# Patient Record
Sex: Female | Born: 1955 | Race: White | Hispanic: No | Smoking: Current every day smoker
Health system: Southern US, Community
[De-identification: ages and names within clinical notes are randomized; demographics above are authoritative.]

## PROBLEM LIST (undated history)

## (undated) DIAGNOSIS — R569 Unspecified convulsions: Secondary | ICD-10-CM

## (undated) DIAGNOSIS — I82409 Acute embolism and thrombosis of unspecified deep veins of unspecified lower extremity: Secondary | ICD-10-CM

## (undated) HISTORY — PX: CHOLECYSTECTOMY: SHX55

---

## 2001-07-21 ENCOUNTER — Inpatient Hospital Stay (HOSPITAL_COMMUNITY): Admission: EM | Admit: 2001-07-21 | Discharge: 2001-07-22 | Payer: Self-pay | Admitting: Emergency Medicine

## 2001-07-21 ENCOUNTER — Encounter: Payer: Self-pay | Admitting: Emergency Medicine

## 2001-08-20 ENCOUNTER — Emergency Department (HOSPITAL_COMMUNITY): Admission: EM | Admit: 2001-08-20 | Discharge: 2001-08-20 | Payer: Self-pay

## 2003-05-05 ENCOUNTER — Emergency Department (HOSPITAL_COMMUNITY): Admission: AD | Admit: 2003-05-05 | Discharge: 2003-05-05 | Payer: Self-pay | Admitting: Emergency Medicine

## 2003-12-06 ENCOUNTER — Emergency Department (HOSPITAL_COMMUNITY): Admission: EM | Admit: 2003-12-06 | Discharge: 2003-12-06 | Payer: Self-pay | Admitting: Emergency Medicine

## 2003-12-12 ENCOUNTER — Emergency Department (HOSPITAL_COMMUNITY): Admission: AD | Admit: 2003-12-12 | Discharge: 2003-12-12 | Payer: Self-pay | Admitting: Emergency Medicine

## 2003-12-20 ENCOUNTER — Emergency Department (HOSPITAL_COMMUNITY): Admission: EM | Admit: 2003-12-20 | Discharge: 2003-12-20 | Payer: Self-pay | Admitting: Emergency Medicine

## 2008-11-08 ENCOUNTER — Emergency Department (HOSPITAL_COMMUNITY): Admission: EM | Admit: 2008-11-08 | Discharge: 2008-11-08 | Payer: Self-pay | Admitting: Emergency Medicine

## 2008-11-16 ENCOUNTER — Emergency Department (HOSPITAL_COMMUNITY): Admission: EM | Admit: 2008-11-16 | Discharge: 2008-11-16 | Payer: Self-pay | Admitting: Emergency Medicine

## 2011-02-02 LAB — DIFFERENTIAL
Basophils Absolute: 0 10*3/uL (ref 0.0–0.1)
Basophils Relative: 0 % (ref 0–1)
Eosinophils Absolute: 0 10*3/uL (ref 0.0–0.7)
Eosinophils Absolute: 0 10*3/uL (ref 0.0–0.7)
Eosinophils Relative: 1 % (ref 0–5)
Lymphocytes Relative: 12 % (ref 12–46)
Lymphocytes Relative: 20 % (ref 12–46)
Lymphs Abs: 0.4 10*3/uL — ABNORMAL LOW (ref 0.7–4.0)
Monocytes Relative: 5 % (ref 3–12)
Monocytes Relative: 6 % (ref 3–12)
Neutro Abs: 2.7 10*3/uL (ref 1.7–7.7)
Neutrophils Relative %: 73 % (ref 43–77)
Neutrophils Relative %: 82 % — ABNORMAL HIGH (ref 43–77)

## 2011-02-02 LAB — URINALYSIS, ROUTINE W REFLEX MICROSCOPIC: Nitrite: NEGATIVE

## 2011-02-02 LAB — CBC
HCT: 37.6 % (ref 36.0–46.0)
HCT: 40.7 % (ref 36.0–46.0)
MCHC: 32.5 g/dL (ref 30.0–36.0)
MCV: 77.9 fL — ABNORMAL LOW (ref 78.0–100.0)
MCV: 79 fL (ref 78.0–100.0)
Platelets: 205 10*3/uL (ref 150–400)
RDW: 17.3 % — ABNORMAL HIGH (ref 11.5–15.5)
WBC: 3.7 10*3/uL — ABNORMAL LOW (ref 4.0–10.5)

## 2011-02-02 LAB — RAPID URINE DRUG SCREEN, HOSP PERFORMED
Amphetamines: NOT DETECTED
Barbiturates: POSITIVE — AB
Cocaine: NOT DETECTED
Opiates: NOT DETECTED

## 2011-02-02 LAB — COMPREHENSIVE METABOLIC PANEL
ALT: 45 U/L — ABNORMAL HIGH (ref 0–35)
ALT: 60 U/L — ABNORMAL HIGH (ref 0–35)
AST: 73 U/L — ABNORMAL HIGH (ref 0–37)
Albumin: 3.6 g/dL (ref 3.5–5.2)
Alkaline Phosphatase: 122 U/L — ABNORMAL HIGH (ref 39–117)
BUN: 4 mg/dL — ABNORMAL LOW (ref 6–23)
BUN: 6 mg/dL (ref 6–23)
Calcium: 8.9 mg/dL (ref 8.4–10.5)
Chloride: 103 mEq/L (ref 96–112)
Creatinine, Ser: 0.7 mg/dL (ref 0.4–1.2)
GFR calc Af Amer: >60 mL/min (ref >60)
GFR calc non Af Amer: >60 mL/min (ref >60)
Potassium: 3.1 mEq/L — ABNORMAL LOW (ref 3.5–5.1)
Potassium: 3.5 mEq/L (ref 3.5–5.1)
Total Protein: 7 g/dL (ref 6.0–8.3)

## 2011-02-02 LAB — LIPASE, BLOOD: Lipase: 24 U/L (ref 11–59)

## 2011-02-02 LAB — ETHANOL: Alcohol, Ethyl (B): 8 mg/dL (ref 0–10)

## 2011-02-02 LAB — POCT CARDIAC MARKERS: Troponin i, poc: <0.05 ng/mL (ref 0.00–0.09)

## 2011-03-06 NOTE — Discharge Summary (Signed)
[place]  Patient:    Alejandra Schneider, Alejandra Schneider Visit Number: [id number] MRN: [id number]          Service Type: MED Location: [phone] Attending Physician:  Phifer, Trinna Post Dictated by:   Rosemarie Ax, M.D. Admit Date:  07/21/2001 Discharge Date: 07/22/2001                             Discharge Summary  DATE OF BIRTH:  September 07, 1966.  SERVICE:  Medical teaching service B.  DISCHARGE MEDICATIONS: 1. Continue receiving Methadone at the [facility]. 2. Iron 325 mg 1 tablet p.o. b.i.d. for anemia. 3. K-Dur 40 mEq 1 tablet p.o. on the night of discharge, then one tablet    p.o. q.d. until the patient goes to the Internal Medicine Clinic at    [facility] to have her potassium level checked.  ACTIVITY:  No instructions.  DIET:  Take clear liquids today and then advance her diet as tolerated.  WOUND CARE:  As advised by Dr. Quillian Quince at [facility].  FOLLOW-UP:  Go to the Internal Medicine Clinic on Monday, July 25, 2001 at 3:30 p.m. to have a potassium level checked.  The patient was also advised to begin seeing a regular doctor.  DISCHARGE DIAGNOSES: 1. Dehydration secondary to nausea and vomiting. 2. Ruled out myocardial infarction. 3. Iron-deficiency anemia. 4. Hypokalemia.  HISTORY OF PRESENT ILLNESS:  Ms. Alejandra Schneider is a 55 year old Caucasian female with a history of heroine addiction who presented to the [facility] Emergency Room with intractable nausea and vomiting and abdominal pain since her laparoscopic cholecystectomy five days ago at [facility].  The patient was discharged from the hospital on postoperative day one, where she went home and continued to have frequent emesis which she reported to be bloody and bilious.  The patient went back to the [facility] Emergency Room where she was admitted overnight and treated with  Dilaudid and Phenergan and then discharged the next day.  The patient stated that she continued to have abdominal pain and vomiting as well as black, tarry stools from her rectum and vaginal bleeding, although her last menstrual period about one week ago.  HOSPITAL COURSE: #1 - ABDOMINAL PAIN/DEHYDRATION:  The patient was treated with IV fluids, made NPO.  She was given IV Phenergan and Protonix.  While in the [facility] Emergency Room, she also reported an episode of chest pain, which will be discussed in the next paragraph.  The patients labs on admission were remarkable for hemoccult negative, amylase 79, lipase 32.  Urinalysis remarkable for small bilirubin, 40 ketones, large blood, trace leukocyte esterase, negative for nitrates.  Admission CBC shows white blood cell count 7.1, hemoglobin 11.5, platelets 218,000. Admission complete metabolic panel shows sodium 138, potassium 3.3, chloride 105, CO2 28, glucose 153, BUN 6, creatinine 0.7, total bilirubin 0.6, alkaline phosphatase 79, AST 27, ALT 22, total protein 6.1, albumin 2.9, calcium 8.5.  On exam, there were no signs of peritonitis or acute bleed and the patients hemoglobin stayed relatively constant during hospitalization.  The patient continued to complain of abdominal pain, but peritonitis, intra-abdominal acute bleed, pancreatitis, gallbladder disease, urinary tract infection, hepatitis were all ruled out by lab values and exam.  By hospital day #1, the patient was able to tolerate clear diet and had slept overnight without requiring Phenergan and had no  emesis whatsoever.  The patient did have a few episodes of diarrhea during hospitalization.  She remained afebrile during hospitalization and her white blood cell count remained stable.  Blood cultures were negative at the time of dictation, drawn on August 17, 2001 but the final reading was still pending.  An acute hepatitis panel was negative for  hepatitis B surface antigen, hepatitis B core IgM antibody, hepatitis A IgM antibody.  Also of note, urine microscopic showed white blood cell count 3 to 6, rbcs 0 to 2, few bacteria.  Complete metabolic panel on the day of discharge overall showed no change.  #2 - CHEST PAIN/RULE OUT MYOCARDIAL INFARCTION:  The patient complained of one episode of chest pain while in the ER.  She had an unremarkable EKG and three cycles of cardiac enzymes were normal.  She did not have any other chest pain during hospitalization.  Chest x-ray on admission was unremarkable and a chest CT on admission showed no evidence of pulmonary embolus, large hiatal hernia, minimal bibasilar atelectasis and scarring.  A limited CT of the lower extremities showed a very faint area of decreased attenuation of the left popliteal band.  So, a short segment DVT could not be excluded; however, the patient did have negative lower extremity dopplers during hospitalization.  So, a PE was ruled out as well.  #3 - IRON-DEFICIENCY ANEMIA:  The patients hemoglobin and hematocrit were low during hospitalization, with hemoglobin around 10 and hematocrit around 30. The patient had iron studies which showed ferratin at the low end of normal range at 16.  Iron low at 26.  Total iron binding capacity normal at 314, percent saturation low at 8.  The patient was thus diagnosed with iron deficient anemia consistent with being a menstruating female.  She was started on iron supplements.  #4 - HYPOKALEMIA:  The patients potassium was low during hospitalization at 3.3 on admission and then 2.9 on hospital day #1.  The patient was supplemented with p.o. potassium and given potassium to take with her and told to go to the Internal Medicine Clinic on July 25, 2001, which is three days after discharge, to have her potassium level checked.  The patient did not show any neurological signs of hypokalemia on exam.  #5 - HISTORY OF HEROINE  ADDICTION:  The patient currently receives methadone  from the [facility].  Of note, she is being rapidly tapered down on her methadone dose as she does not attend a group meeting and therefore does not meet the criteria to receive methadone from this clinic. The patient is extremely irate about this.  The methadone clinic informed us that her current dose with them is 20 mg of methadone per day, so that does was continued during hospitalization.  No opiate withdrawal symptoms were noted. Dictated by:   Rosemarie Ax, M.D. Attending Physician:  Phifer, Trinna Post DD:  07/22/01 TD:  07/24/01 Job: [phone] [id number]

## 2011-07-14 ENCOUNTER — Emergency Department (HOSPITAL_COMMUNITY)
Admission: EM | Admit: 2011-07-14 | Discharge: 2011-07-14 | Disposition: A | Discharge status: Home / Self Care | Payer: Self-pay | Attending: Emergency Medicine | Admitting: Emergency Medicine

## 2011-07-14 DIAGNOSIS — F3289 Other specified depressive episodes: Secondary | ICD-10-CM | POA: Insufficient documentation

## 2011-07-14 DIAGNOSIS — F411 Generalized anxiety disorder: Secondary | ICD-10-CM | POA: Insufficient documentation

## 2011-07-14 DIAGNOSIS — F329 Major depressive disorder, single episode, unspecified: Secondary | ICD-10-CM | POA: Insufficient documentation

## 2011-07-14 LAB — RAPID URINE DRUG SCREEN, HOSP PERFORMED
Amphetamines: NOT DETECTED
Barbiturates: NOT DETECTED
Tetrahydrocannabinol: NOT DETECTED

## 2011-07-14 LAB — POCT I-STAT, CHEM 8
BUN: 13 mg/dL (ref 6–23)
Creatinine, Ser: 0.9 mg/dL (ref 0.50–1.10)
HCT: 43 % (ref 36.0–46.0)
Potassium: 4.4 mEq/L (ref 3.5–5.1)
TCO2: 29 mmol/L (ref 0–100)

## 2011-07-14 LAB — ETHANOL: Alcohol, Ethyl (B): <11 mg/dL (ref 0–11)

## 2012-02-01 ENCOUNTER — Emergency Department (HOSPITAL_COMMUNITY)
Admission: EM | Admit: 2012-02-01 | Discharge: 2012-02-01 | Disposition: A | Discharge status: Home / Self Care | Payer: Self-pay | Attending: Emergency Medicine | Admitting: Emergency Medicine

## 2012-02-01 ENCOUNTER — Encounter (HOSPITAL_COMMUNITY): Payer: Self-pay | Admitting: *Deleted

## 2012-02-01 DIAGNOSIS — F17200 Nicotine dependence, unspecified, uncomplicated: Secondary | ICD-10-CM | POA: Insufficient documentation

## 2012-02-01 DIAGNOSIS — G4700 Insomnia, unspecified: Secondary | ICD-10-CM

## 2012-02-01 DIAGNOSIS — Z86718 Personal history of other venous thrombosis and embolism: Secondary | ICD-10-CM | POA: Insufficient documentation

## 2012-02-01 DIAGNOSIS — R011 Cardiac murmur, unspecified: Secondary | ICD-10-CM | POA: Insufficient documentation

## 2012-02-01 HISTORY — DX: Unspecified convulsions: R56.9

## 2012-02-01 HISTORY — DX: Acute embolism and thrombosis of unspecified deep veins of unspecified lower extremity: I82.409

## 2012-02-01 MED ORDER — ALPRAZOLAM 0.5 MG PO TABS: 0.5000 mg | ORAL_TABLET | Freq: Every evening | ORAL | Status: AC | PRN

## 2012-02-01 NOTE — ED Notes (Signed)
Pt states she has been "stressed out and unable to sleep" for 2 weeks. Was "turned down" for Medicaid and is trying to get disability. Lives with her brother. Hx of seizures but has been off her meds x 1 year. States had a seizure last week. Unable to drive due to seizures. Denies SI.

## 2012-02-01 NOTE — ED Notes (Signed)
Pt reports insomnia and anxious x 2 months.  Pt is requesting something for sleep and to calm her down.  Pt reports she does "not have no kinda help in any way."  Pt reports that she cannot drive and is having to depend on someone.

## 2012-02-01 NOTE — ED Provider Notes (Signed)
History     CSN: [id number]  Arrival date & time 02/01/12  1048   First MD Initiated Contact with Patient 02/01/12 1232      Chief Complaint  Patient presents with  . Insomnia    (Consider location/radiation/quality/duration/timing/severity/associated sxs/prior treatment) The history is provided by the patient.   the patient is a 56 year old, female, who presents to emergency department complaining that she cannot sleep.  She states that her husband.  Sister and mother all died over last 3 years.  Therefore, she is depressed and her migration is all that time.  She denies nightmares.  She denies any somatic symptoms.  She denies suicidal or homicidal thoughts.  She does not use drugs or alcohol.  Past Medical History  Diagnosis Date  . Seizures   . Deep vein thrombosis (DVT)     Past Surgical History  Procedure Date  . Cholecystectomy     No family history on file.  History  Substance Use Topics  . Smoking status: Current Everyday Smoker -- 4.0 packs/day    Types: Cigarettes  . Smokeless tobacco: Not on file  . Alcohol Use: No    OB History    Grav Para Term Preterm Abortions TAB SAB Ect Mult Living                  Review of Systems  Psychiatric/Behavioral: Positive for sleep disturbance. Negative for confusion and agitation.  All other systems reviewed and are negative.    Allergies  Codeine; Haldol; Penicillins; and Phenobarbital  Home Medications   Current Outpatient Rx  Name Route Sig Dispense Refill  . ACETAMINOPHEN 500 MG PO TABS Oral Take 500 mg by mouth every 6 (six) hours as needed. PAIN      BP 125/91  Pulse 92  Temp(Src) 98.3 F (36.8 C) (Oral)  Resp 12  SpO2 98%  Physical Exam  Vitals reviewed. Constitutional: She is oriented to person, place, and time. She appears well-developed and well-nourished.  HENT:  Head: Normocephalic and atraumatic.  Eyes: Conjunctivae are normal.  Neck: Normal range of motion.  Cardiovascular: Normal  rate.   No murmur heard. Pulmonary/Chest: Effort normal. No respiratory distress.  Musculoskeletal: Normal range of motion.  Neurological: She is alert and oriented to person, place, and time.  Skin: Skin is warm and dry.  Psychiatric: She has a normal mood and affect. Judgment and thought content normal.    ED Course  Procedures (including critical care time)  Labs Reviewed - No data to display No results found.   No diagnosis found.    MDM  insomnia        Cheri Guppy, MD 02/01/12 1536

## 2012-02-01 NOTE — ED Notes (Signed)
Reviewed Rx for Xanax and f/u with Behavioral Health.

## 2012-02-01 NOTE — Discharge Instructions (Signed)
Use xanax to help you sleep.  Follow up with Behavioral Health for reevaluation and discussion of your depression.  Return for worse symptoms.

## 2012-08-02 ENCOUNTER — Emergency Department (HOSPITAL_COMMUNITY)
Admission: EM | Admit: 2012-08-02 | Discharge: 2012-08-02 | Disposition: A | Discharge status: Home / Self Care | Payer: Self-pay | Attending: Emergency Medicine | Admitting: Emergency Medicine

## 2012-08-02 ENCOUNTER — Encounter (HOSPITAL_COMMUNITY): Payer: Self-pay | Admitting: Emergency Medicine

## 2012-08-02 DIAGNOSIS — F411 Generalized anxiety disorder: Secondary | ICD-10-CM | POA: Insufficient documentation

## 2012-08-02 DIAGNOSIS — F329 Major depressive disorder, single episode, unspecified: Secondary | ICD-10-CM

## 2012-08-02 DIAGNOSIS — F419 Anxiety disorder, unspecified: Secondary | ICD-10-CM

## 2012-08-02 DIAGNOSIS — F17200 Nicotine dependence, unspecified, uncomplicated: Secondary | ICD-10-CM | POA: Insufficient documentation

## 2012-08-02 DIAGNOSIS — Z8673 Personal history of transient ischemic attack (TIA), and cerebral infarction without residual deficits: Secondary | ICD-10-CM | POA: Insufficient documentation

## 2012-08-02 DIAGNOSIS — F3289 Other specified depressive episodes: Secondary | ICD-10-CM | POA: Insufficient documentation

## 2012-08-02 LAB — BASIC METABOLIC PANEL
CO2: 25 mEq/L (ref 19–32)
Calcium: 9.5 mg/dL (ref 8.4–10.5)
Chloride: 98 mEq/L (ref 96–112)
GFR calc Af Amer: >90 mL/min (ref >90)

## 2012-08-02 MED ORDER — ALPRAZOLAM 0.5 MG PO TABS: 0.5000 mg | ORAL_TABLET | Freq: Every evening | ORAL | Status: DC | PRN

## 2012-08-02 NOTE — ED Notes (Signed)
Pt states she had a panic attack this morning  Pt states she has not been sleeping lately  Pt states this am she broke out in a sweat and was hyperventilating  Pt states she has a friend that is a nurse who came over and helped her  Pt states over this past month she has been feeling worse

## 2012-08-02 NOTE — ED Provider Notes (Signed)
History     CSN: [id number]  Arrival date & time 08/02/12  4132   First MD Initiated Contact with Patient 08/02/12 [phone]      Chief Complaint  Patient presents with  . Anxiety    (Consider location/radiation/quality/duration/timing/severity/associated sxs/prior treatment) Patient is a 56 y.o. female presenting with anxiety. The history is provided by the patient.  Anxiety This is a recurrent problem. Pertinent negatives include no chest pain, no abdominal pain, no headaches and no shortness of breath.   patient has been depressed recently. She states she had 3 family members die and that her son who is her only connection wants to join the [employer] She states it is getting to be a lot for her. She states she's not been sleeping well lately. She states that she today she broke out in a sweat and started hyperventilating. His been feeling worse over the last few months. No real history of depression. No suicidal thoughts. No weight loss. She states she has been eating less. She denies substance abuse. She tried Unisom and was not able to sleep. She has a previous history of seizures states she's not had one in a year. She states she's trying to get disability because she cannot work because her license was taken away. She states that she would be better if she just moved back to [place]. She states she's going to go back there in December.  Past Medical History  Diagnosis Date  . Seizures   . Deep vein thrombosis (DVT)     Past Surgical History  Procedure Date  . Cholecystectomy     Family History  Problem Relation Age of Onset  . Alzheimer's disease Mother   . COPD Mother   . ALS Sister     History  Substance Use Topics  . Smoking status: Current Every Day Smoker -- 4.0 packs/day    Types: Cigarettes  . Smokeless tobacco: Not on file  . Alcohol Use: No    OB History    Grav Para Term Preterm Abortions TAB SAB Ect Mult Living                  Review of Systems    Constitutional: Positive for diaphoresis. Negative for activity change and appetite change.  HENT: Negative for neck stiffness.   Eyes: Negative for pain.  Respiratory: Negative for chest tightness and shortness of breath.   Cardiovascular: Negative for chest pain and leg swelling.  Gastrointestinal: Negative for nausea, vomiting, abdominal pain and diarrhea.  Genitourinary: Negative for flank pain.  Musculoskeletal: Negative for back pain.  Skin: Negative for rash.  Neurological: Negative for weakness, numbness and headaches.  Psychiatric/Behavioral: Positive for dysphoric mood. Negative for behavioral problems and decreased concentration. The patient is not hyperactive.     Allergies  Codeine; Haldol; Penicillins; and Phenobarbital  Home Medications   Current Outpatient Rx  Name Route Sig Dispense Refill  . ACETAMINOPHEN 500 MG PO TABS Oral Take 500 mg by mouth every 6 (six) hours as needed. PAIN    . DOXYLAMINE SUCCINATE (SLEEP) 25 MG PO TABS Oral Take 25 mg by mouth at bedtime as needed. For sleep    . ALPRAZOLAM 0.5 MG PO TABS Oral Take 1 tablet (0.5 mg total) by mouth at bedtime as needed for sleep. 8 tablet 0    BP 116/71  Pulse 81  Temp 98.5 F (36.9 C) (Oral)  Resp 16  SpO2 96%  Physical Exam  Constitutional: She is oriented to person,  place, and time. She appears well-developed and well-nourished.  HENT:  Head: Normocephalic.  Eyes: Pupils are equal, round, and reactive to light.  Neck: No thyromegaly present.  Cardiovascular: Normal rate and regular rhythm.   Pulmonary/Chest: Effort normal and breath sounds normal.  Abdominal: Soft.  Musculoskeletal: Normal range of motion.  Neurological: She is alert and oriented to person, place, and time.  Skin: Skin is warm.  Psychiatric:       Appears depressed    ED Course  Procedures (including critical care time)  Labs Reviewed  BASIC METABOLIC PANEL - Abnormal; Notable for the following:    Sodium 134 (*)      Glucose, Bld 120 (*)     All other components within normal limits   No results found.   1. Depression   2. Anxiety      Date: 08/02/2012  Rate: 83  Rhythm: normal sinus rhythm  QRS Axis: normal  Intervals: normal  ST/T Wave abnormalities: normal  Conduction Disutrbances:none  Narrative Interpretation:   Old EKG Reviewed: none available  I  MDM  Patient with anxiety and depression. Recent family members who died. She does not appear to be a suicide risk at this time. The patient has been given followup information. She'll be discharged home with some Xanax to help her sleep.        Juliet Rude. Rubin Payor, MD 08/02/12 [phone]

## 2012-10-13 ENCOUNTER — Emergency Department (HOSPITAL_COMMUNITY)
Admission: EM | Admit: 2012-10-13 | Discharge: 2012-10-13 | Disposition: A | Discharge status: Home / Self Care | Payer: Self-pay | Attending: Emergency Medicine | Admitting: Emergency Medicine

## 2012-10-13 ENCOUNTER — Encounter (HOSPITAL_COMMUNITY): Payer: Self-pay | Admitting: Emergency Medicine

## 2012-10-13 DIAGNOSIS — F419 Anxiety disorder, unspecified: Secondary | ICD-10-CM

## 2012-10-13 DIAGNOSIS — F17200 Nicotine dependence, unspecified, uncomplicated: Secondary | ICD-10-CM | POA: Insufficient documentation

## 2012-10-13 DIAGNOSIS — F5104 Psychophysiologic insomnia: Secondary | ICD-10-CM

## 2012-10-13 DIAGNOSIS — G4700 Insomnia, unspecified: Secondary | ICD-10-CM | POA: Insufficient documentation

## 2012-10-13 DIAGNOSIS — F329 Major depressive disorder, single episode, unspecified: Secondary | ICD-10-CM

## 2012-10-13 DIAGNOSIS — F411 Generalized anxiety disorder: Secondary | ICD-10-CM | POA: Insufficient documentation

## 2012-10-13 DIAGNOSIS — Z79899 Other long term (current) drug therapy: Secondary | ICD-10-CM | POA: Insufficient documentation

## 2012-10-13 DIAGNOSIS — F3289 Other specified depressive episodes: Secondary | ICD-10-CM | POA: Insufficient documentation

## 2012-10-13 LAB — COMPREHENSIVE METABOLIC PANEL
AST: 33 U/L (ref 0–37)
Albumin: 4 g/dL (ref 3.5–5.2)
Alkaline Phosphatase: 131 U/L — ABNORMAL HIGH (ref 39–117)
BUN: 14 mg/dL (ref 6–23)
Chloride: 99 mEq/L (ref 96–112)
Potassium: 3.7 mEq/L (ref 3.5–5.1)
Total Bilirubin: 0.3 mg/dL (ref 0.3–1.2)

## 2012-10-13 LAB — CBC
HCT: 40.5 % (ref 36.0–46.0)
Platelets: 182 10*3/uL (ref 150–400)
RBC: 4.72 MIL/uL (ref 3.87–5.11)
RDW: 14.2 % (ref 11.5–15.5)
WBC: 4.6 10*3/uL (ref 4.0–10.5)

## 2012-10-13 MED ORDER — CLONAZEPAM 0.5 MG PO TABS: 0.5000 mg | ORAL_TABLET | Freq: Two times a day (BID) | ORAL | Status: AC | PRN

## 2012-10-13 NOTE — ED Notes (Signed)
Patient states that because she is more and more depressed that she is having seizures more frequently. The patient had one seizure today because of the depression. The patient states that she has been off her medications x 6 months because she lost her medicaid

## 2012-10-13 NOTE — ED Provider Notes (Signed)
History     CSN: [id number]  Arrival date & time 10/13/12  1458   First MD Initiated Contact with Patient 10/13/12 1622      Chief Complaint  Patient presents with  . Seizures  . Depression    (Consider location/radiation/quality/duration/timing/severity/associated sxs/prior treatment) HPI This 56 year old female is a long-standing history of anxiety depression insomnia and has not been able to establish a primary care doctor or behavioral specialist and she was last seen in the emergency department a few months ago, she states she does not live in [facility], she used to be on methadone but has not taken that for months and was on it for prior narcotic dependence, she denies suicidal or homicidal ideation denies hallucinations denies withdrawal, she states that she has a this she will hyperventilate and occasionally have seizures but has not taken Klonopin for seizures in months except when she was prescribed a small amount of Xanax in the emergency department a few months ago. She tried to establish herself as a new patient at a methadone clinic today than what to an urgent care then went to a [facility] and called EMS from [employer] store to come to the ED for her chronic anxiety and chronic depression. There is no treatment today prior to arrival. Past Medical History  Diagnosis Date  . Seizures   . Deep vein thrombosis (DVT)     Past Surgical History  Procedure Date  . Cholecystectomy     Family History  Problem Relation Age of Onset  . Alzheimer's disease Mother   . COPD Mother   . ALS Sister     History  Substance Use Topics  . Smoking status: Current Every Day Smoker -- 4.0 packs/day    Types: Cigarettes  . Smokeless tobacco: Not on file  . Alcohol Use: No    OB History    Grav Para Term Preterm Abortions TAB SAB Ect Mult Living                  Review of Systems 10 Systems reviewed and are negative for acute change except as noted in the  HPI. Allergies  Codeine; Haldol; Penicillins; and Phenobarbital  Home Medications   Current Outpatient Rx  Name  Route  Sig  Dispense  Refill  . ACETAMINOPHEN 500 MG PO TABS   Oral   Take 500 mg by mouth every 6 (six) hours as needed. PAIN         . ALPRAZOLAM 0.5 MG PO TABS   Oral   Take 0.5 mg by mouth at bedtime as needed.         Marland Kitchen CLONAZEPAM 0.5 MG PO TABS   Oral   Take 1 tablet (0.5 mg total) by mouth 2 (two) times daily as needed for anxiety.   12 tablet   0     BP 141/101  Pulse 109  Temp 98.3 F (36.8 C) (Oral)  SpO2 95%  Physical Exam  Nursing note and vitals reviewed. Constitutional: She is oriented to person, place, and time.       Awake, alert, nontoxic appearance with baseline speech for patient.  HENT:  Head: Atraumatic.  Mouth/Throat: No oropharyngeal exudate.  Eyes: EOM are normal. Pupils are equal, round, and reactive to light. Right eye exhibits no discharge. Left eye exhibits no discharge.  Neck: Neck supple.  Cardiovascular: Normal rate and regular rhythm.   No murmur heard. Pulmonary/Chest: Effort normal and breath sounds normal. No stridor. No respiratory  distress. She has no wheezes. She has no rales. She exhibits no tenderness.  Abdominal: Soft. Bowel sounds are normal. She exhibits no mass. There is no tenderness. There is no rebound.  Musculoskeletal: She exhibits no tenderness.       Baseline ROM, moves extremities with no obvious new focal weakness.  Lymphadenopathy:    She has no cervical adenopathy.  Neurological: She is alert and oriented to person, place, and time.       Awake, alert, cooperative and aware of situation; motor strength bilaterally; sensation normal to light touch bilaterally; peripheral visual fields full to confrontation; no facial asymmetry; tongue midline; major cranial nerves appear intact; no pronator drift, normal finger to nose bilaterally, baseline gait without new ataxia.  Skin: No rash noted.   Psychiatric:       Anxious and depressed without suicidal or homicidal ideation or apparent psychosis    ED Course  Procedures (including critical care time) D/w ACT to assist Pt with resource options in [place]. 1635 Labs Reviewed  COMPREHENSIVE METABOLIC PANEL - Abnormal; Notable for the following:    Glucose, Bld 104 (*)     Alkaline Phosphatase 131 (*)     All other components within normal limits  CBC  ETHANOL  LAB REPORT - SCANNED   No results found.   1. Anxiety   2. Depression   3. Chronic insomnia       MDM  Pt stable in ED with no significant deterioration in condition.  Patient informed of clinical course, understand medical decision-making process, and agree with plan.  I doubt any other EMC precluding discharge at this time including, but not necessarily limited to the following:SI, HI, psychosis.         Hurman Horn, MD 10/14/12 1556

## 2012-10-13 NOTE — ED Notes (Signed)
methdaone clinic this am to be established as patient, was told no, walked to [place], was told could not be seen there, went to [employer] and  [facility], states had a seizure this am, has not taken meds x 6 months because cannot afford them, pt taking pain meds for back pain, per EMS no urinary incontinence or evidence of seizure, pt requested transport to ED to refill meds

## 2019-12-08 MED ORDER — ACETAMINOPHEN 325 MG PO TABS
650.00 | ORAL_TABLET | ORAL | Status: DC
Start: ? — End: 2019-12-08

## 2019-12-08 MED ORDER — SODIUM CHLORIDE 0.9 % IV SOLN
10.00 | INTRAVENOUS | Status: DC
Start: ? — End: 2019-12-08

## 2019-12-08 MED ORDER — CLONAZEPAM 1 MG PO TABS
1.00 | ORAL_TABLET | ORAL | Status: DC
Start: 2019-12-08 — End: 2019-12-08

## 2019-12-08 MED ORDER — PHENYTOIN SODIUM EXTENDED 100 MG PO CAPS
100.00 | ORAL_CAPSULE | ORAL | Status: DC
Start: 2019-12-08 — End: 2019-12-08

## 2019-12-08 MED ORDER — GENERIC EXTERNAL MEDICATION
Status: DC
Start: ? — End: 2019-12-08

## 2019-12-08 MED ORDER — CLONIDINE HCL 0.1 MG PO TABS
0.10 | ORAL_TABLET | ORAL | Status: DC
Start: 2019-12-08 — End: 2019-12-08

## 2019-12-08 MED ORDER — POLYETHYLENE GLYCOL 3350 17 GM/SCOOP PO POWD
17.00 | ORAL | Status: DC
Start: ? — End: 2019-12-08

## 2019-12-08 MED ORDER — METHADONE HCL 10 MG PO TABS
150.00 | ORAL_TABLET | ORAL | Status: DC
Start: 2019-12-09 — End: 2019-12-08

## 2019-12-08 MED ORDER — ENOXAPARIN SODIUM 40 MG/0.4ML ~~LOC~~ SOLN
40.00 | SUBCUTANEOUS | Status: DC
Start: 2019-12-08 — End: 2019-12-08

## 2021-11-10 ENCOUNTER — Inpatient Hospital Stay (HOSPITAL_COMMUNITY)
Admission: EM | Admit: 2021-11-10 | Discharge: 2021-11-12 | DRG: 312 | Disposition: A | Discharge status: Home / Self Care | Payer: Medicaid Other | Attending: Internal Medicine | Admitting: Internal Medicine

## 2021-11-10 ENCOUNTER — Emergency Department (HOSPITAL_COMMUNITY): Payer: Medicaid Other

## 2021-11-10 ENCOUNTER — Other Ambulatory Visit: Payer: Self-pay

## 2021-11-10 ENCOUNTER — Observation Stay (HOSPITAL_COMMUNITY): Payer: Medicaid Other

## 2021-11-10 DIAGNOSIS — Z20822 Contact with and (suspected) exposure to covid-19: Secondary | ICD-10-CM | POA: Diagnosis present

## 2021-11-10 DIAGNOSIS — I10 Essential (primary) hypertension: Secondary | ICD-10-CM | POA: Diagnosis present

## 2021-11-10 DIAGNOSIS — K449 Diaphragmatic hernia without obstruction or gangrene: Secondary | ICD-10-CM | POA: Diagnosis present

## 2021-11-10 DIAGNOSIS — G40909 Epilepsy, unspecified, not intractable, without status epilepticus: Secondary | ICD-10-CM | POA: Diagnosis present

## 2021-11-10 DIAGNOSIS — F1190 Opioid use, unspecified, uncomplicated: Secondary | ICD-10-CM | POA: Diagnosis present

## 2021-11-10 DIAGNOSIS — Z95828 Presence of other vascular implants and grafts: Secondary | ICD-10-CM

## 2021-11-10 DIAGNOSIS — Z880 Allergy status to penicillin: Secondary | ICD-10-CM

## 2021-11-10 DIAGNOSIS — F32A Depression, unspecified: Secondary | ICD-10-CM | POA: Diagnosis present

## 2021-11-10 DIAGNOSIS — G8929 Other chronic pain: Secondary | ICD-10-CM | POA: Diagnosis present

## 2021-11-10 DIAGNOSIS — R1013 Epigastric pain: Secondary | ICD-10-CM | POA: Diagnosis present

## 2021-11-10 DIAGNOSIS — E860 Dehydration: Secondary | ICD-10-CM | POA: Diagnosis present

## 2021-11-10 DIAGNOSIS — R55 Syncope and collapse: Principal | ICD-10-CM | POA: Diagnosis present

## 2021-11-10 DIAGNOSIS — Z888 Allergy status to other drugs, medicaments and biological substances status: Secondary | ICD-10-CM

## 2021-11-10 DIAGNOSIS — R1012 Left upper quadrant pain: Secondary | ICD-10-CM

## 2021-11-10 DIAGNOSIS — Z7901 Long term (current) use of anticoagulants: Secondary | ICD-10-CM

## 2021-11-10 DIAGNOSIS — R001 Bradycardia, unspecified: Secondary | ICD-10-CM | POA: Diagnosis present

## 2021-11-10 DIAGNOSIS — F411 Generalized anxiety disorder: Secondary | ICD-10-CM | POA: Diagnosis present

## 2021-11-10 DIAGNOSIS — J449 Chronic obstructive pulmonary disease, unspecified: Secondary | ICD-10-CM | POA: Diagnosis present

## 2021-11-10 DIAGNOSIS — E278 Other specified disorders of adrenal gland: Secondary | ICD-10-CM | POA: Diagnosis present

## 2021-11-10 DIAGNOSIS — Z86718 Personal history of other venous thrombosis and embolism: Secondary | ICD-10-CM

## 2021-11-10 DIAGNOSIS — Z885 Allergy status to narcotic agent status: Secondary | ICD-10-CM

## 2021-11-10 DIAGNOSIS — F17210 Nicotine dependence, cigarettes, uncomplicated: Secondary | ICD-10-CM | POA: Diagnosis present

## 2021-11-10 DIAGNOSIS — Z79899 Other long term (current) drug therapy: Secondary | ICD-10-CM

## 2021-11-10 LAB — URINALYSIS, ROUTINE W REFLEX MICROSCOPIC
Bilirubin Urine: NEGATIVE
Glucose, UA: 50 mg/dL — AB
Hgb urine dipstick: NEGATIVE
Ketones, ur: 5 mg/dL — AB
Nitrite: NEGATIVE
Protein, ur: NEGATIVE mg/dL
Specific Gravity, Urine: 1.019 (ref 1.005–1.030)
pH: 5 (ref 5.0–8.0)

## 2021-11-10 LAB — BASIC METABOLIC PANEL
Anion gap: 14 (ref 5–15)
BUN: 10 mg/dL (ref 8–23)
CO2: 28 mmol/L (ref 22–32)
Calcium: 9.7 mg/dL (ref 8.9–10.3)
Chloride: 97 mmol/L — ABNORMAL LOW (ref 98–111)
Creatinine, Ser: 0.84 mg/dL (ref 0.44–1.00)
GFR, Estimated: >60 mL/min (ref >60)
Glucose, Bld: 205 mg/dL — ABNORMAL HIGH (ref 70–99)
Potassium: 3.6 mmol/L (ref 3.5–5.1)
Sodium: 139 mmol/L (ref 135–145)

## 2021-11-10 LAB — RESP PANEL BY RT-PCR (FLU A&B, COVID) ARPGX2
Influenza A by PCR: NEGATIVE
Influenza B by PCR: NEGATIVE
SARS Coronavirus 2 by RT PCR: NEGATIVE

## 2021-11-10 LAB — LACTIC ACID, PLASMA: Lactic Acid, Venous: 2.1 mmol/L (ref 0.5–1.9)

## 2021-11-10 LAB — CBC
HCT: 48.2 % — ABNORMAL HIGH (ref 36.0–46.0)
Hemoglobin: 14.9 g/dL (ref 12.0–15.0)
MCH: 26.3 pg (ref 26.0–34.0)
MCHC: 30.9 g/dL (ref 30.0–36.0)
MCV: 85 fL (ref 80.0–100.0)
Platelets: 191 10*3/uL (ref 150–400)
RBC: 5.67 MIL/uL — ABNORMAL HIGH (ref 3.87–5.11)
RDW: 15.5 % (ref 11.5–15.5)
WBC: 5.2 10*3/uL (ref 4.0–10.5)
nRBC: 0 % (ref 0.0–0.2)

## 2021-11-10 LAB — MAGNESIUM: Magnesium: 2 mg/dL (ref 1.7–2.4)

## 2021-11-10 LAB — CBG MONITORING, ED: Glucose-Capillary: 232 mg/dL — ABNORMAL HIGH (ref 70–99)

## 2021-11-10 LAB — TROPONIN I (HIGH SENSITIVITY): Troponin I (High Sensitivity): 5 ng/L (ref <18)

## 2021-11-10 LAB — LIPASE, BLOOD: Lipase: 36 U/L (ref 11–51)

## 2021-11-10 MED ORDER — ACETAMINOPHEN 650 MG RE SUPP: 650.0000 mg | Freq: Four times a day (QID) | RECTAL | Status: DC | PRN

## 2021-11-10 MED ORDER — ACETAMINOPHEN 325 MG PO TABS
650.0000 mg | ORAL_TABLET | Freq: Four times a day (QID) | ORAL | Status: DC | PRN
  Administered 2021-11-11 – 2021-11-12 (×5): 650 mg via ORAL
  Filled 2021-11-10 (×6): qty 2

## 2021-11-10 MED ORDER — IOHEXOL 350 MG/ML SOLN
100.0000 mL | Freq: Once | INTRAVENOUS | Status: AC | PRN
  Administered 2021-11-10: 100 mL via INTRAVENOUS

## 2021-11-10 MED ORDER — HYDRALAZINE HCL 20 MG/ML IJ SOLN
10.0000 mg | Freq: Once | INTRAMUSCULAR | Status: AC
  Administered 2021-11-10: 10 mg via INTRAVENOUS
  Filled 2021-11-10: qty 1

## 2021-11-10 MED ORDER — METHADONE HCL 10 MG PO TABS
150.0000 mg | ORAL_TABLET | Freq: Every day | ORAL | Status: DC
  Administered 2021-11-11 – 2021-11-12 (×2): 150 mg via ORAL
  Filled 2021-11-10 (×2): qty 15

## 2021-11-10 MED ORDER — CLONIDINE HCL 0.1 MG PO TABS
0.1000 mg | ORAL_TABLET | Freq: Two times a day (BID) | ORAL | Status: DC
  Administered 2021-11-10 – 2021-11-12 (×4): 0.1 mg via ORAL
  Filled 2021-11-10 (×5): qty 1

## 2021-11-10 MED ORDER — LACTATED RINGERS IV BOLUS
1000.0000 mL | Freq: Once | INTRAVENOUS | Status: AC
  Administered 2021-11-10: 1000 mL via INTRAVENOUS

## 2021-11-10 MED ORDER — ENOXAPARIN SODIUM 40 MG/0.4ML IJ SOSY: 40.0000 mg | PREFILLED_SYRINGE | INTRAMUSCULAR | Status: DC

## 2021-11-10 MED ORDER — FENTANYL CITRATE PF 50 MCG/ML IJ SOSY
50.0000 ug | PREFILLED_SYRINGE | Freq: Once | INTRAMUSCULAR | Status: AC
  Administered 2021-11-10: 50 ug via INTRAVENOUS
  Filled 2021-11-10 (×2): qty 1

## 2021-11-10 MED ORDER — ONDANSETRON 4 MG PO TBDP: 4.0000 mg | ORAL_TABLET | Freq: Three times a day (TID) | ORAL | 0 refills | Status: DC | PRN

## 2021-11-10 MED ORDER — AMLODIPINE BESYLATE 5 MG PO TABS
5.0000 mg | ORAL_TABLET | Freq: Every day | ORAL | Status: DC
  Administered 2021-11-10 – 2021-11-12 (×3): 5 mg via ORAL
  Filled 2021-11-10 (×3): qty 1

## 2021-11-10 MED ORDER — DIPHENHYDRAMINE HCL 50 MG/ML IJ SOLN
25.0000 mg | Freq: Once | INTRAMUSCULAR | Status: AC
  Administered 2021-11-10: 25 mg via INTRAVENOUS
  Filled 2021-11-10: qty 1

## 2021-11-10 MED ORDER — CLONAZEPAM 0.5 MG PO TABS
0.5000 mg | ORAL_TABLET | Freq: Every day | ORAL | Status: DC | PRN
  Administered 2021-11-10 – 2021-11-11 (×2): 0.5 mg via ORAL
  Filled 2021-11-10 (×2): qty 1

## 2021-11-10 MED ORDER — LORAZEPAM 2 MG/ML IJ SOLN
1.0000 mg | Freq: Once | INTRAMUSCULAR | Status: AC
  Administered 2021-11-10: 1 mg via INTRAVENOUS
  Filled 2021-11-10: qty 1

## 2021-11-10 MED ORDER — APIXABAN 5 MG PO TABS
5.0000 mg | ORAL_TABLET | Freq: Two times a day (BID) | ORAL | Status: DC
  Administered 2021-11-10 – 2021-11-12 (×4): 5 mg via ORAL
  Filled 2021-11-10 (×4): qty 1

## 2021-11-10 MED ORDER — DROPERIDOL 2.5 MG/ML IJ SOLN: 1.2500 mg | INTRAMUSCULAR | Status: DC | PRN

## 2021-11-10 MED ORDER — IOHEXOL 350 MG/ML SOLN
42.0000 mL | Freq: Once | INTRAVENOUS | Status: AC | PRN
  Administered 2021-11-10: 42 mL via INTRAVENOUS

## 2021-11-10 MED ORDER — PHENYTOIN SODIUM EXTENDED 100 MG PO CAPS
200.0000 mg | ORAL_CAPSULE | Freq: Two times a day (BID) | ORAL | Status: DC
  Administered 2021-11-10 – 2021-11-12 (×4): 200 mg via ORAL
  Filled 2021-11-10 (×5): qty 2

## 2021-11-10 MED ORDER — ONDANSETRON HCL 4 MG/2ML IJ SOLN: 4.0000 mg | Freq: Four times a day (QID) | INTRAMUSCULAR | Status: DC | PRN

## 2021-11-10 MED ORDER — LIDOCAINE VISCOUS HCL 2 % MT SOLN
15.0000 mL | Freq: Once | OROMUCOSAL | Status: AC
Start: 2021-11-10 — End: 2021-11-10
  Administered 2021-11-10: 15 mL via ORAL
  Filled 2021-11-10: qty 15

## 2021-11-10 MED ORDER — ALUM & MAG HYDROXIDE-SIMETH 200-200-20 MG/5ML PO SUSP
30.0000 mL | Freq: Once | ORAL | Status: AC
  Administered 2021-11-10: 30 mL via ORAL
  Filled 2021-11-10: qty 30

## 2021-11-10 MED ORDER — METOCLOPRAMIDE HCL 5 MG/ML IJ SOLN
10.0000 mg | Freq: Once | INTRAMUSCULAR | Status: AC
  Administered 2021-11-10: 10 mg via INTRAVENOUS
  Filled 2021-11-10: qty 2

## 2021-11-10 MED ORDER — HYDROXYZINE HCL 25 MG PO TABS: 25.0000 mg | ORAL_TABLET | Freq: Once | ORAL | Status: DC

## 2021-11-10 MED ORDER — ONDANSETRON 4 MG PO TBDP
4.0000 mg | ORAL_TABLET | Freq: Once | ORAL | Status: AC | PRN
  Administered 2021-11-10: 4 mg via ORAL
  Filled 2021-11-10: qty 1

## 2021-11-10 NOTE — Discharge Instructions (Addendum)
You were evaluated in the Emergency Department and after careful evaluation, we did not find any emergent condition requiring admission or further testing in the hospital.  Your exam/testing today was overall reassuring. Your labs revealed normal pancreatic enzymes, some concern for dehydration with a lactic acid of 2.1 and a urinalysis with ketones. Your Ct imaging revealed a large hiatal hernia and a malpositioned IVC filter, both of which could be causing abdominal discomfort and nausea and vomiting. A prescription for zofran has been provided.  CT results:   IMPRESSION:  VASCULAR     1. No acute vascular abnormality. Specifically, no evidence of acute  or chronic mesenteric ischemia.  2. Malpositioned chronic indwelling infrarenal IVC filter, unchanged  from 20/10 comparison.     NON-VASCULAR     1. No acute abdominopelvic abnormality.  2. Incompletely evaluated type 4 hiatal hernia. No evidence of  obstruction or strangulation. Chest CT could be obtained for further  characterization as clinically indicated.  3. Interval development of indeterminate but likely benign left  adrenal nodule measuring up to 1.4 cm. Twelve month follow-up CT or  MRI adrenal mass protocol could be considered for surveillance.    Please return to the Emergency Department if you experience any worsening of your condition.  Thank you for allowing [place] to be a part of your care.           Internal Medicine Dr's Discharge instructions: You were seen in the hospital for an episode of fainting.  We think this is because you vomited multiple times and were dehydrated before passing out.  We did some work-up for your heart as a potential cause of your fainting, however this was normal here.  You also had some vertigo while you were here, and our physical therapist recommends physical therapy for this outside of the hospital.  I will place a referral for this for you.  Otherwise, the changes we made to your  medications are as follows:  START enoxaparin, 80 mg twice daily, to decrease your risk of blood clots START keppra, 500 mg twice daily, for your seizures START amlodipine 5 mg daily for your blood pressure  STOP phenytoin STOP apixaban  Please follow-up with your primary care doctor within the next few days to 1 week to discuss your recent hospitalization.  We would also like you to be seen by neurology outside of the hospital.  I will place referral for this as well.  If any symptoms change or worsen acutely, please return to the nearest emergency department.

## 2021-11-10 NOTE — Progress Notes (Addendum)
Ultrasound to Entire Left lower forearm - nothing visualized to access for CT ( 20 # ) or even a # 22. Ultrasound to Right arm - was able to provide IV access # 20 , 1.88" Right proximal lower forearm for CT. RN aware that patient has nothing else to access either lower forearm.

## 2021-11-10 NOTE — ED Triage Notes (Signed)
Pt via EMS from methadone clinic for syncopal episode. No fall, was lowered to the ground by staff. Hx of seizure. Taking blood thinner d/t DVT. EMS reports hr irregular between 40-70. On EMS arrival she was alert but not verbally responding but has now improved, although she does not have memory of what happened at the clinic. States she did not feel well when she got up this morning, someone drove her to the clinic, and she does not remember after that. Pt nauseated and vomiting x 2. Endorses abdominal pain and chronic back pain.

## 2021-11-10 NOTE — H&P (Addendum)
Date: 11/10/2021               Patient Name:  Alejandra Schneider MRN: [id number]  DOB: 1956-07-11 Age / Sex: 66 y.o., female   PCP: [facility]         Medical Service: Internal Medicine Teaching Service         Attending Physician: Dr. Dickie La, MD    First Contact: Dr. Alroy Bailiff Pager: [phone]  Second Contact: Dr. Cyndie Chime Pager: [phone]       After Hours (After 5p/  First Contact Pager: [phone]  weekends / holidays): Second Contact Pager: [phone]   Chief Complaint: LOC  History of Present Illness: Alejandra Schneider is a 66 y.o. 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  Patient states that, earlier this morning, she was walking to the methadone clinic when she suddenly passed out.  Unclear if she was nauseous before the event.  She denies feeling warm or diaphoretic before the event.  Denies palpitations, new chest pain, or shortness of breath.  Denies urinary or bowel incontinence.  She had an episode of emesis and abdominal pain right after passing out.  Her abdominal pain is in the LUQ and radiates to the back.  She rates it as a 9/10, describes it as constant.  No aggravating relieving factors.  Also endorses chills.  She endorses poor p.o. intake in the past few days and states that it has been "days" since she has had anything to eat.  Her last bowel movement was yesterday, describes it as normal in caliber.  States that she feels more short of breath but denies cough or sputum production.  Denies recent illness, dysuria, or bowel issues.  The patient states that she is on clonidine for elevated blood pressures and that she has been taking this regularly.  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  She says she stopped Eliquis months ago.  Her last seizure  was about a month ago.  She endorses compliance with her antiseizure medication.  No other complaints or concerns at this time.  Chronic abdominal pain  Meds:  Current Meds  Medication Sig   acetaminophen (TYLENOL) 500 MG tablet Take 1,000 mg by mouth every 6 (six) hours as needed for headache.   clonazePAM (KLONOPIN) 0.5 MG tablet Take 1 tablet (0.5 mg total) by mouth 2 (two) times daily as needed for anxiety. (Patient taking differently: Take 0.5 mg by mouth 2 (two) times daily.)   cloNIDine (CATAPRES) 0.1 MG tablet Take 0.1 mg by mouth in the morning and at bedtime.   ELIQUIS 5 MG TABS tablet Take 5 mg by mouth in the morning and at bedtime.   methadone (DOLOPHINE) 10 MG/5ML solution Take 150 mg by mouth daily.   ondansetron (ZOFRAN-ODT) 4 MG disintegrating tablet Take 1 tablet (4 mg total) by mouth every 8 (eight) hours as needed for nausea or vomiting.   phenytoin (DILANTIN) 200 MG ER capsule Take 200 mg by mouth 2 (two) times daily.    Allergies: Allergies as of 11/10/2021 - Review Complete 11/10/2021  Allergen Reaction Noted   Diazepam Other (See Comments) 10/26/2013   Codeine Itching 02/01/2012   Haldol [haloperidol decanoate] Swelling 02/01/2012   Penicillins Hives 02/01/2012  Phenobarbital Itching 02/01/2012   Pentazocine Anxiety, Nausea And Vomiting, and Rash 09/14/2012   Past Medical History:  Diagnosis Date   Deep vein thrombosis (DVT) (HCC)    Seizures (HCC)    Family History: Endorses diabetes and heart attack in the family, unable to elaborate  Social History: Lives in [place]. Smokes 6-8 cigarettes a day, for the past 30 years. Denies alcohol use. No other substance use. Goes to methadone clinic daily, her dose is 150 mg daily.  Review of Systems: A complete ROS was negative except as per HPI.   Physical Exam: Blood pressure (!) 191/81, pulse 64, temperature 98.2 F (36.8 C), temperature source Oral, resp. rate 20, height 5' (1.524 m), weight 80.7 kg, SpO2  99 %. General: NAD, nl appearance, sleepy-appearing HE: Normocephalic, atraumatic, EOMI, Conjunctivae normal ENT: No congestion, no rhinorrhea, no exudate or erythema, dry mucous membranes Cardiovascular: Normal rate, regular rhythm. II/VI systolic murmur at LUSB. No rubs or gallops, pulses are intact Pulmonary: Effort normal, breath sounds normal. No wheezes, rales, or rhonchi Abdominal: soft, bowel sounds present, +LUQ tenderness to palpation, no other TTP Musculoskeletal: no deformity, injury or tenderness in extremities, minimal/trace edema in RLE. Skin: Warm, dry, no bruising, erythema, or rash Psychiatric/Behavioral: normal mood, normal behavior    EKG: personally reviewed my interpretation is sinus rhythm with marked sinus arrhythmia, with Q waves particularly in lateral leads  CXR: personally reviewed my interpretation is mild cardiomegaly but otherwise no acute abnormality  CT head without contrast: Unremarkable head CT with no acute intracranial pathology.   CT Angio Abd/Pelvis W/WO Contrast IMPRESSION:  VASCULAR 1. No acute vascular abnormality. Specifically, no evidence of acute or chronic mesenteric ischemia. 2. Malpositioned chronic indwelling infrarenal IVC filter, unchanged from 20/10 comparison.   NON-VASCULAR 1. No acute abdominopelvic abnormality. 2. Incompletely evaluated type 4 hiatal hernia. No evidence of obstruction or strangulation. Chest CT could be obtained for further characterization as clinically indicated. 3. Interval development of indeterminate but likely benign left adrenal nodule measuring up to 1.4 cm. Twelve month follow-up CT or MRI adrenal mass protocol could be considered for surveillance.   Assessment & Plan by Problem: Principal Problem:   Syncope and collapse   Syncope H/o DVT with IVC filter, not on AC H/o seizures on phenytoin Patient has a history of prior DVT with IVC filter, but she has not been on anticoagulation for the  past 3 months. She is here today after a syncopal episode which came on without warning.  On arrival, the patient was mildly bradycardic and hypertensive to 201/90, saturating 98% on room air. Exam notable for minimal edema in RLE, systolic murmur at LUSB. Labs significant for CBG 232, trop x2 flat, lactic acid 2.1. Lipase WNL. CT angio abd/pelvis shows a malpositioned chronic indwelling infrarenal IVC filter. Overall presentation suggestive of cardiogenic syncope, cannot rule out PE. Other items on the differential include cardiogenic syncope 2/2 aortic stenosis vs vasovagal syncope 2/2 dehydration (dry mucous membranes, recent decreased PO intake, possible nausea prior to syncope). Clinical presentation is less likely suggestive of a seizure, and pt endorses compliance with dilantin. -CT angio chest pending -Echocardiogram pending -S/p LR bolus in the ED -Continue phenytoin 200 mg PO BID -Phenytoin levels pending -Orthostatic vitals in the AM -Trend CBC, BMP -Telemetry  HTN, severe Pt endorses taking clonidine at home for HTN. Review of records shows that pt is also on lisinopril 20 mg daily and HCTZ 25 mg daily. Systolic BP in the 190s in the room. Will  give antihypertensives as below. -Resume amlodipine 5 mg daily  -Resume clonidine 0.1 mg PO BID -S/p IV hydral 10 mg  Acute on chronic abdominal pain Patient has a large hiatal hernia noted on prior CT scans.  Most recent CT scan today shows an incompletely evaluated type IV hiatal hernia.  Today, patient endorses new onset epigastric/LUQ pain ever since her vomiting episode earlier.  Lipase levels are within normal limits. -Zofran PRN -Continue to monitor  Anxiety Depression -s/p Ativan in the ED -Continue clonazepam 0.5 mg daily PRN  H/o heroin use Denies current use. Attends the methadone clinic daily. -Continue methadone 150 mg daily  Dispo: Admit patient to Observation with expected length of stay less than 2  midnights.  Signed: Andrey CampanileBonanno, Calianne Larue E, MD 11/10/2021, 6:38 PM  Pager: [phone]  After 5pm on weekdays and 1pm on weekends: On Call pager: [phone]

## 2021-11-10 NOTE — ED Provider Notes (Signed)
[facility] EMERGENCY DEPARTMENT Provider Note   CSN: [id number] Arrival date & time: 11/10/21  [id number]     History  Chief Complaint  Patient presents with   Loss of Consciousness    Alejandra Schneider is a 66 y.o. female.   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  The patient was walking to her methadone clinic earlier today when she developed sudden onset nausea, diaphoresis, lightheadedness and experienced a syncopal episode.  She was found by EMS shortly after her syncopal episode to be bradycardic concerning for possible vasovagal episode.  Was reportedly lowered to the ground by staff and had no witnessed seizure activity and did not hit her head.  She subsequently regained consciousness.  Has no recollection of the syncopal episode.  She denies any chest pain at that time. No seizure activity was noted.  She additionally endorses acute on chronic abdominal pain.  She endorses epigastric and left upper quadrant abdominal discomfort with associated NBNB emesis.  She endorses radiation of the pain to her back.  She endorses significant anxiety related to the possibility that she may have pancreatic cancer as she had family members die of it. She denies any chest pain, shortness of breath, or palpitations. Her  last bowel movement was yesterday and was normal.  Home Medications Prior to Admission medications   Medication Sig Start Date End Date Taking? Authorizing Provider  acetaminophen (TYLENOL) 500 MG tablet Take 1,000 mg by mouth every 6 (six) hours as needed for headache.   Yes [provider]  clonazePAM (KLONOPIN) 0.5 MG tablet Take 1 tablet (0.5 mg total) by mouth 2 (two) times daily as needed for anxiety. Patient taking differently: Take 0.5 mg by mouth 2 (two)  times daily. 10/13/12  Yes Riki Altes, MD  cloNIDine (CATAPRES) 0.1 MG tablet Take 0.1 mg by mouth in the morning and at bedtime. 01/26/20  Yes [provider]  ELIQUIS 5 MG TABS tablet Take 5 mg by mouth in the morning and at bedtime. 09/26/21  Yes [provider]  methadone (DOLOPHINE) 10 MG/5ML solution Take 150 mg by mouth daily.   Yes [provider]  ondansetron (ZOFRAN-ODT) 4 MG disintegrating tablet Take 1 tablet (4 mg total) by mouth every 8 (eight) hours as needed for nausea or vomiting. 11/10/21  Yes Regan Lemming, MD  phenytoin (DILANTIN) 200 MG ER capsule Take 200 mg by mouth 2 (two) times daily. 10/10/21  Yes [provider]      Allergies    Diazepam, Codeine, Haldol [haloperidol decanoate], Penicillins, Phenobarbital, and Pentazocine    Review of Systems   Review of Systems  Cardiovascular:  Positive for syncope.  All other systems reviewed and are negative.  Physical Exam Updated Vital Signs BP (!) 191/81    Pulse 64    Temp 98.2 F (36.8 C) (Oral)    Resp 20    Ht 5' (1.524 m)    Wt 80.7 kg    SpO2 99%    BMI 34.76 kg/m  Physical Exam Vitals and nursing note reviewed.  Constitutional:      General: She is in acute distress.     Appearance: She is well-developed.     Comments: In acute pain, very anxious appearing  HENT:     Head: Normocephalic and atraumatic.  Eyes:  Conjunctiva/sclera: Conjunctivae normal.     Pupils: Pupils are equal, round, and reactive to light.  Cardiovascular:     Rate and Rhythm: Normal rate and regular rhythm.  Pulmonary:     Effort: Pulmonary effort is normal. No respiratory distress.     Breath sounds: Normal breath sounds.  Abdominal:     General: There is no distension.     Palpations: Abdomen is soft.     Tenderness: There is abdominal tenderness in the epigastric area. There is no guarding.  Musculoskeletal:        General: No swelling, deformity or signs of injury.     Cervical back:  Neck supple.  Skin:    General: Skin is warm and dry.     Capillary Refill: Capillary refill takes less than 2 seconds.     Findings: No lesion or rash.  Neurological:     General: No focal deficit present.     Mental Status: She is alert. Mental status is at baseline.     Cranial Nerves: No cranial nerve deficit.     Sensory: No sensory deficit.     Motor: No weakness.  Psychiatric:        Mood and Affect: Mood is anxious. Affect is tearful.    ED Results / Procedures / Treatments   Labs (all labs ordered are listed, but only abnormal results are displayed) Labs Reviewed  BASIC METABOLIC PANEL - Abnormal; Notable for the following components:      Result Value   Chloride 97 (*)    Glucose, Bld 205 (*)    All other components within normal limits  CBC - Abnormal; Notable for the following components:   RBC 5.67 (*)    HCT 48.2 (*)    All other components within normal limits  URINALYSIS, ROUTINE W REFLEX MICROSCOPIC - Abnormal; Notable for the following components:   APPearance HAZY (*)    Glucose, UA 50 (*)    Ketones, ur 5 (*)    Leukocytes,Ua TRACE (*)    Bacteria, UA RARE (*)    Non Squamous Epithelial 0-5 (*)    All other components within normal limits  LACTIC ACID, PLASMA - Abnormal; Notable for the following components:   Lactic Acid, Venous 2.1 (*)    All other components within normal limits  CBG MONITORING, ED - Abnormal; Notable for the following components:   Glucose-Capillary 232 (*)    All other components within normal limits  RESP PANEL BY RT-PCR (FLU A&B, COVID) ARPGX2  LIPASE, BLOOD  MAGNESIUM  BASIC METABOLIC PANEL  CBC  HIV ANTIBODY (ROUTINE TESTING W REFLEX)  PHENYTOIN LEVEL, TOTAL  TROPONIN I (HIGH SENSITIVITY)    EKG EKG Interpretation  Date/Time:  Monday November 10 2021 08:47:40 EST Ventricular Rate:  60 PR Interval:  146 QRS Duration: 82 QT Interval:  448 QTC Calculation: 448 R Axis:   72 Text Interpretation: Sinus rhythm with  marked sinus arrhythmia Septal infarct , age undetermined Possible Lateral infarct , age undetermined Abnormal ECG When compared with ECG of 02-Aug-2012 07:51, PREVIOUS ECG IS PRESENT Confirmed by Regan Lemming ([id number]) on 11/10/2021 11:16:09 AM  Radiology CT Head Wo Contrast  Result Date: 11/10/2021 CLINICAL DATA:  Syncope, dizziness history of seizure EXAM: CT HEAD WITHOUT CONTRAST TECHNIQUE: Contiguous axial images were obtained from the base of the skull through the vertex without intravenous contrast. RADIATION DOSE REDUCTION: This exam was performed according to the departmental dose-optimization program which includes automated exposure control, adjustment of  the mA and/or kV according to patient size and/or use of iterative reconstruction technique. COMPARISON:  CT head 12/12/2003 FINDINGS: Brain: There is no evidence of acute intracranial hemorrhage, extra-axial fluid collection, or acute infarct. Parenchymal volume is normal. The ventricles are normal in size. There is no significant burden of white matter microangiopathic change. There is no mass lesion. There is no midline shift. Vascular: There is mild calcification of the intracranial ICAs. No dense vessel is seen. Skull: Normal. Negative for fracture or focal lesion. Sinuses/Orbits: The paranasal sinuses are clear. The globes and orbits are unremarkable. Other: None. IMPRESSION: Unremarkable head CT with no acute intracranial pathology. Electronically Signed   By: Valetta Mole M.D.   On: 11/10/2021 10:21   DG Chest Portable 1 View  Result Date: 11/10/2021 CLINICAL DATA:  Epigastric abdominal pain. EXAM: PORTABLE CHEST 1 VIEW COMPARISON:  None. FINDINGS: Mildly enlarged heart, accentuated on the right due to patient rotation to the right. Left-sided aortic arch with calcifications. Clear lungs with normal vascularity. Mild bilateral glenohumeral joint degenerative changes. IMPRESSION: Mild cardiomegaly.  No acute abnormality. Electronically  Signed   By: Claudie Revering M.D.   On: 11/10/2021 11:55   CT Angio Abd/Pel W and/or Wo Contrast  Result Date: 11/10/2021 CLINICAL DATA:  66 year old female with history of syncopal episode, abdominal pain. EXAM: CT ANGIOGRAPHY ABDOMEN AND PELVIS WITH CONTRAST AND WITHOUT CONTRAST TECHNIQUE: Multidetector CT imaging of the abdomen and pelvis was performed using the standard protocol during bolus administration of intravenous contrast. Multiplanar reconstructed images and MIPs were obtained and reviewed to evaluate the vascular anatomy. CONTRAST:  174mL OMNIPAQUE IOHEXOL 350 MG/ML SOLN COMPARISON:  11/08/2008 FINDINGS: VASCULAR Aorta: Atherosclerosis without signficant stenosis, dissection, or aneurysm. Celiac: Patent without evidence of aneurysm, dissection, vasculitis or significant stenosis. SMA: Patent without evidence of aneurysm, dissection, vasculitis or significant stenosis. Renals: Dual right and single left renal arteries are patent without evidence of aneurysm, dissection, vasculitis, fibromuscular dysplasia or significant stenosis. IMA: Patent without evidence of aneurysm, dissection, vasculitis or significant stenosis. Inflow: Patent without evidence of aneurysm, dissection, vasculitis or significant stenosis. Proximal Outflow: Bilateral common femoral and visualized portions of the superficial and profunda femoral arteries are patent without evidence of aneurysm, dissection, vasculitis or significant stenosis. Veins: The hepatic veins are widely patent. The portal system is widely patent and normal in caliber. The renal veins are patent bilaterally in standard anatomic configuration. Infrarenal Bard Simon Nitinol inferior vena cava filter in place with extraluminal position of multiple struts which extend into the abdominal aorta, duodenum, and pericaval retroperitoneal fat. No evidence of iliocaval thrombosis or anomaly. Review of the MIP images confirms the above findings. NON-VASCULAR Lower chest:  No acute abnormality. Hepatobiliary: No focal liver abnormality is seen. Status post cholecystectomy. No biliary dilatation. Pancreas: Unremarkable. No pancreatic ductal dilatation or surrounding inflammatory changes. Spleen: Normal in size without focal abnormality. Adrenals/Urinary Tract: Hypoattenuating left adrenal nodule measuring up to 1.4 cm. The right adrenal gland is within normal limits. Again seen are multifocal bilateral simple renal cysts, now more numerous than 2010 comparison, the largest exophytic about the right interpolar region measuring up to 1.8 cm. No hydronephrosis. No nephrolithiasis. Stomach/Bowel: Incompletely evaluated type 4 hiatal hernia which includes a loop of transverse colon. The appendix is not definitively identified. No evidence of bowel wall thickening or dilation. Lymphatic: No abdominopelvic lymphadenopathy. Reproductive: Uterus and bilateral adnexa are unremarkable. Other: No abdominal wall hernia or abnormality. No abdominopelvic ascites. Musculoskeletal: No acute osseous abnormality. Advanced degenerative changes of  the hips bilaterally, right greater than left. IMPRESSION: VASCULAR 1. No acute vascular abnormality. Specifically, no evidence of acute or chronic mesenteric ischemia. 2. Malpositioned chronic indwelling infrarenal IVC filter, unchanged from 20/10 comparison. NON-VASCULAR 1. No acute abdominopelvic abnormality. 2. Incompletely evaluated type 4 hiatal hernia. No evidence of obstruction or strangulation. Chest CT could be obtained for further characterization as clinically indicated. 3. Interval development of indeterminate but likely benign left adrenal nodule measuring up to 1.4 cm. Twelve month follow-up CT or MRI adrenal mass protocol could be considered for surveillance. ([employer], [employer], Boland GL, Francis IR, [place] GM, Sorento PJ, [employer] [place] [employer], Pandhar[place]ande [place]. Management of Incidental Adrenal Masses: A White Paper of the ACR Incidental Findings  Committee. J Am Coll Radiol. 2017 Aug;14(8):1038-1044. doi: 10.1016/j.jacr.2017.05.001. Epub 2017 Jun 23. PMID: [id number].) Ruthann Cancer, MD [facility] Radiology Electronically Signed   By: Ruthann Cancer M.D.   On: 11/10/2021 15:46    Procedures Procedures    Medications Ordered in ED Medications  methadone (DOLOPHINE) 10 MG/5ML solution 150 mg (has no administration in time range)  cloNIDine (CATAPRES) tablet 0.1 mg (has no administration in time range)  phenytoin (DILANTIN) ER capsule 200 mg (has no administration in time range)  clonazePAM (KLONOPIN) tablet 0.5 mg (has no administration in time range)  acetaminophen (TYLENOL) tablet 650 mg (has no administration in time range)    Or  acetaminophen (TYLENOL) suppository 650 mg (has no administration in time range)  ondansetron (ZOFRAN) injection 4 mg (has no administration in time range)  amLODipine (NORVASC) tablet 5 mg (has no administration in time range)  apixaban (ELIQUIS) tablet 5 mg (has no administration in time range)  ondansetron (ZOFRAN-ODT) disintegrating tablet 4 mg (4 mg Oral Given 11/10/21 0900)  alum & mag hydroxide-simeth (MAALOX/MYLANTA) 200-200-20 MG/5ML suspension 30 mL (30 mLs Oral Given 11/10/21 1215)    And  lidocaine (XYLOCAINE) 2 % viscous mouth solution 15 mL (15 mLs Oral Given 11/10/21 1215)  fentaNYL (SUBLIMAZE) injection 50 mcg (50 mcg Intravenous Given 11/10/21 1327)  lactated ringers bolus 1,000 mL (0 mLs Intravenous Stopped 11/10/21 1522)  LORazepam (ATIVAN) injection 1 mg (1 mg Intravenous Given 11/10/21 1423)  iohexol (OMNIPAQUE) 350 MG/ML injection 100 mL (100 mLs Intravenous Contrast Given 11/10/21 1516)  metoCLOPramide (REGLAN) injection 10 mg (10 mg Intravenous Given 11/10/21 1703)  diphenhydrAMINE (BENADRYL) injection 25 mg (25 mg Intravenous Given 11/10/21 1703)  hydrALAZINE (APRESOLINE) injection 10 mg (10 mg Intravenous Given 11/10/21 1705)    ED Course/  Medical Decision Making/ A&P Clinical Course as of 11/10/21 1954  Mon Nov 10, 2021  1747 N/v, chronic abdominal pain, failed po challenge, large hiatial hernia [MK]    Clinical Course User Index [MK] Kommor, Debe Coder, MD                           Medical Decision Making Amount and/or Complexity of Data Reviewed Labs: ordered. Radiology: ordered.  Risk OTC drugs. Prescription drug management. Decision regarding hospitalization.    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  The patient was walking to her methadone clinic earlier today when she developed sudden onset nausea, diaphoresis, lightheadedness and experienced a syncopal episode.  She was found by EMS shortly after her syncopal episode to be bradycardic concerning for possible vasovagal episode.  Was reportedly lowered to the ground by staff and had no witnessed seizure activity and did not hit her head.  She subsequently regained consciousness.  Has no recollection of the syncopal episode.  She denies any chest pain at that time. No seizure activity was noted.  She additionally endorses acute on chronic abdominal pain.  She endorses epigastric and left upper quadrant abdominal discomfort with associated NBNB emesis.  She endorses radiation of the pain to her back.  She endorses significant anxiety related to the possibility that she may have pancreatic cancer as she had family members die of it. She denies any chest pain, shortness of breath, or palpitations. Her  last bowel movement was yesterday and was normal.  On arrival, the patient was afebrile, mildly bradycardic P 52, hypertensive BP 201/90, saturating 98% on room air.  Patient presents after a syncopal episode with a prodrome consistent with likely vasovagal syncope.  She now complains of epigastric acute  on chronic abdominal pain that radiates to her back.  Sinus bradycardia noted on initial cardiac telemetry.  The patient has been unable to tolerate any oral intake.  She denies any NBNB emesis.  EKG on arrival significant for sinus rhythm, ventricular rate 60, no ST segment changes to indicate coronary ischemia.  IV access was obtained and the patient was administered an IV fluid bolus, IV fentanyl, IV Ativan for anxiousness.   Differential diagnosis is broad and the following list is by no means exhaustive and includes gastroparesis, hiatal hernia, pancreatitis, pancreatic cancer, cholecystitis, cholelithiasis, small bowel obstruction, mesenteric ischemia, constipation, viral gastritis, hyperglycemia/DKA.  Laboratory work-up initiated to include a CBG of 232, CBC without a leukocytosis, anemia or platelet abnormality, BMP generally unremarkable with exception of mild hyperglycemia to 205, COVID-19 and influenza PCR testing negative, troponins x2 negative, lactic acid 2.1, repeat pending, lipase normal, magnesium 2.0, urinalysis without clear evidence of UTI.  A CT head was performed which revealed no acute intracranial abnormality, chest x-ray revealed no acute cardiac or pulmonary abnormality and was reviewed by myself and radiology.  I agree with the radiology interpretation.  CT angiogram abdomen pelvis was performed which revealed no vascular abnormality with a malpositioned chronic indwelling IVC filter present unchanged from previous studies, no acute abdominal pelvic abnormality with a type IV large hiatal hernia presents with no evidence of obstruction or strangulation.  A CT chest was ordered.  The patient received IV Reglan and Benadryl with no significant improvement in her nausea and vomiting.  She failed a p.o. challenge in the emergency department and was unable to tolerate any oral intake.  At this time, low suspicion for cardiac cardiogenic etiology of the patient's syncopal episode.   The patient had a prodrome and had nausea, diaphoresis and bradycardia consistent with likely vasovagal syncope in the setting of her abdominal pain and large hiatal hernia.  Given the patient's inability to tolerate oral intake, I did recommend admission for IV fluid resuscitation as there was evidence of dehydration on the patient's laboratory work-up and the patient still cannot tolerate oral water.  Medicine consulted for admission.     Final Clinical Impression(s) / ED Diagnoses Final diagnoses:  Dehydration  Vasovagal syncope  Hiatal hernia  Left upper quadrant abdominal pain    Rx / DC Orders ED Discharge Orders          Ordered    ondansetron (ZOFRAN-ODT) 4 MG disintegrating tablet  Every 8 hours PRN        11/10/21 1635  Regan Lemming, MD 11/10/21 Karl Bales

## 2021-11-11 ENCOUNTER — Observation Stay (HOSPITAL_COMMUNITY): Payer: Medicaid Other

## 2021-11-11 ENCOUNTER — Encounter (HOSPITAL_COMMUNITY): Payer: Self-pay | Admitting: Internal Medicine

## 2021-11-11 DIAGNOSIS — R1013 Epigastric pain: Secondary | ICD-10-CM | POA: Diagnosis present

## 2021-11-11 DIAGNOSIS — R109 Unspecified abdominal pain: Secondary | ICD-10-CM

## 2021-11-11 DIAGNOSIS — R55 Syncope and collapse: Principal | ICD-10-CM

## 2021-11-11 DIAGNOSIS — F419 Anxiety disorder, unspecified: Secondary | ICD-10-CM | POA: Diagnosis not present

## 2021-11-11 DIAGNOSIS — G8929 Other chronic pain: Secondary | ICD-10-CM | POA: Diagnosis present

## 2021-11-11 DIAGNOSIS — Z880 Allergy status to penicillin: Secondary | ICD-10-CM | POA: Diagnosis not present

## 2021-11-11 DIAGNOSIS — Z7901 Long term (current) use of anticoagulants: Secondary | ICD-10-CM | POA: Diagnosis not present

## 2021-11-11 DIAGNOSIS — G40909 Epilepsy, unspecified, not intractable, without status epilepticus: Secondary | ICD-10-CM | POA: Diagnosis present

## 2021-11-11 DIAGNOSIS — R9431 Abnormal electrocardiogram [ECG] [EKG]: Secondary | ICD-10-CM

## 2021-11-11 DIAGNOSIS — F32A Depression, unspecified: Secondary | ICD-10-CM

## 2021-11-11 DIAGNOSIS — Z885 Allergy status to narcotic agent status: Secondary | ICD-10-CM | POA: Diagnosis not present

## 2021-11-11 DIAGNOSIS — K449 Diaphragmatic hernia without obstruction or gangrene: Secondary | ICD-10-CM | POA: Diagnosis present

## 2021-11-11 DIAGNOSIS — F1121 Opioid dependence, in remission: Secondary | ICD-10-CM

## 2021-11-11 DIAGNOSIS — Z95828 Presence of other vascular implants and grafts: Secondary | ICD-10-CM | POA: Diagnosis not present

## 2021-11-11 DIAGNOSIS — E278 Other specified disorders of adrenal gland: Secondary | ICD-10-CM | POA: Diagnosis present

## 2021-11-11 DIAGNOSIS — Z79899 Other long term (current) drug therapy: Secondary | ICD-10-CM | POA: Diagnosis not present

## 2021-11-11 DIAGNOSIS — Z20822 Contact with and (suspected) exposure to covid-19: Secondary | ICD-10-CM | POA: Diagnosis present

## 2021-11-11 DIAGNOSIS — J449 Chronic obstructive pulmonary disease, unspecified: Secondary | ICD-10-CM | POA: Diagnosis present

## 2021-11-11 DIAGNOSIS — F17210 Nicotine dependence, cigarettes, uncomplicated: Secondary | ICD-10-CM | POA: Diagnosis present

## 2021-11-11 DIAGNOSIS — F411 Generalized anxiety disorder: Secondary | ICD-10-CM | POA: Diagnosis present

## 2021-11-11 DIAGNOSIS — F1190 Opioid use, unspecified, uncomplicated: Secondary | ICD-10-CM | POA: Diagnosis present

## 2021-11-11 DIAGNOSIS — Z86718 Personal history of other venous thrombosis and embolism: Secondary | ICD-10-CM | POA: Diagnosis not present

## 2021-11-11 DIAGNOSIS — E860 Dehydration: Secondary | ICD-10-CM | POA: Diagnosis present

## 2021-11-11 DIAGNOSIS — Z888 Allergy status to other drugs, medicaments and biological substances status: Secondary | ICD-10-CM | POA: Diagnosis not present

## 2021-11-11 DIAGNOSIS — I10 Essential (primary) hypertension: Secondary | ICD-10-CM | POA: Diagnosis present

## 2021-11-11 DIAGNOSIS — R001 Bradycardia, unspecified: Secondary | ICD-10-CM | POA: Diagnosis present

## 2021-11-11 LAB — ECHOCARDIOGRAM COMPLETE
Area-P 1/2: 3.6 cm2
Calc EF: 73.4 %
Height: 60 in
S' Lateral: 1.3 cm
Single Plane A2C EF: 69.9 %
Single Plane A4C EF: 78 %
Weight: 2848 oz

## 2021-11-11 LAB — CBC
HCT: 40.5 % (ref 36.0–46.0)
Hemoglobin: 12.7 g/dL (ref 12.0–15.0)
MCH: 26.2 pg (ref 26.0–34.0)
MCHC: 31.4 g/dL (ref 30.0–36.0)
MCV: 83.5 fL (ref 80.0–100.0)
Platelets: 167 10*3/uL (ref 150–400)
RBC: 4.85 MIL/uL (ref 3.87–5.11)
RDW: 15.7 % — ABNORMAL HIGH (ref 11.5–15.5)
WBC: 4.7 10*3/uL (ref 4.0–10.5)
nRBC: 0 % (ref 0.0–0.2)

## 2021-11-11 LAB — BASIC METABOLIC PANEL
Anion gap: 8 (ref 5–15)
BUN: 8 mg/dL (ref 8–23)
CO2: 30 mmol/L (ref 22–32)
Calcium: 9.1 mg/dL (ref 8.9–10.3)
Chloride: 99 mmol/L (ref 98–111)
Creatinine, Ser: 0.74 mg/dL (ref 0.44–1.00)
GFR, Estimated: >60 mL/min (ref >60)
Glucose, Bld: 110 mg/dL — ABNORMAL HIGH (ref 70–99)
Potassium: 3.8 mmol/L (ref 3.5–5.1)
Sodium: 137 mmol/L (ref 135–145)

## 2021-11-11 LAB — HIV ANTIBODY (ROUTINE TESTING W REFLEX): HIV Screen 4th Generation wRfx: NONREACTIVE

## 2021-11-11 LAB — LACTIC ACID, PLASMA: Lactic Acid, Venous: 1.5 mmol/L (ref 0.5–1.9)

## 2021-11-11 LAB — PHENYTOIN LEVEL, TOTAL: Phenytoin Lvl: <2.5 ug/mL — ABNORMAL LOW (ref 10.0–20.0)

## 2021-11-11 MED ORDER — PANTOPRAZOLE SODIUM 40 MG PO TBEC
40.0000 mg | DELAYED_RELEASE_TABLET | Freq: Every day | ORAL | Status: DC
  Administered 2021-11-11 – 2021-11-12 (×2): 40 mg via ORAL
  Filled 2021-11-11 (×2): qty 1

## 2021-11-11 MED ORDER — CLONAZEPAM 0.5 MG PO TABS
0.5000 mg | ORAL_TABLET | Freq: Two times a day (BID) | ORAL | Status: DC | PRN
  Administered 2021-11-11 – 2021-11-12 (×2): 0.5 mg via ORAL
  Filled 2021-11-11 (×2): qty 1

## 2021-11-11 MED ORDER — ALUM & MAG HYDROXIDE-SIMETH 200-200-20 MG/5ML PO SUSP
30.0000 mL | ORAL | Status: DC | PRN
  Administered 2021-11-11 – 2021-11-12 (×2): 30 mL via ORAL
  Filled 2021-11-11 (×2): qty 30

## 2021-11-11 MED ORDER — PERFLUTREN LIPID MICROSPHERE
1.0000 mL | INTRAVENOUS | Status: AC | PRN
  Administered 2021-11-11: 11:00:00 2 mL via INTRAVENOUS
  Filled 2021-11-11: qty 10

## 2021-11-11 NOTE — ED Notes (Signed)
Breakfast orders placed 

## 2021-11-11 NOTE — Evaluation (Signed)
Physical Therapy Evaluation Patient Details Name: Alejandra Schneider MRN: [id number] DOB: Aug 17, 1956 Today's Date: 11/11/2021  History of Present Illness  Alejandra Schneider is a 67 y.o. 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 Lives with her brother in a single level home with a level entrance; Independent, typically does not use an assistive device (but is wondering if a RW would help); Reports difficulty keeping her eyes steady with quick head turns; has not driven since seizures; Presents to PT overall moving well; Orthostatics negative for BP drop; Noted she had been nauseated before her fall; in the absence of orthostasis, with nausea, and with pt report of likely vestibular dysfunction, I wonder if this fall could be related to a vestibular problem; will ask for a closer look at her vestibular function next PT session;  Pt currently with functional limitations due to the deficits listed below (see PT Problem List). Pt will benefit from skilled PT to increase their independence and safety with mobility to allow discharge to the venue listed below.          Recommendations for follow up therapy are one component of a multi-disciplinary discharge planning process, led by the attending physician.  Recommendations may be updated based on patient status, additional functional criteria and insurance authorization.  Follow Up Recommendations Outpatient PT (for gait and balance dysfunction)    Assistance Recommended at Discharge PRN  Patient can return home with the following  A little help with walking and/or transfers    Equipment Recommendations Rollator (4 wheels);Other (comment) (will consider a rollator)  Recommendations for Other Services  Other (comment) (Vestibular Assessment)    Functional Status  Assessment Patient has had a recent decline in their functional status and demonstrates the ability to make significant improvements in function in a reasonable and predictable amount of time.     Precautions / Restrictions Precautions Precautions: Fall Restrictions Weight Bearing Restrictions: No      Mobility  Bed Mobility Overal bed mobility: Independent                  Transfers Overall transfer level: Needs assistance Equipment used: None Transfers: Sit to/from Stand Sit to Stand: Min guard (without physical contact)           General transfer comment: no overt difficulty coming to stand; cues to self-monitor for activity tolerance    Ambulation/Gait Ambulation/Gait assistance: Min guard (with and without physical contact) Gait Distance (Feet): 30 Feet (around her room) Assistive device: Rolling walker (2 wheels) Gait Pattern/deviations: Step-through pattern       General Gait Details: Cues to self-monitor for activity tolerance, and for RW proximity and use; overall managing well for short distance  Stairs            Wheelchair Mobility    Modified Rankin (Stroke Patients Only)       Balance Overall balance assessment: Mild deficits observed, not formally tested                                           Pertinent Vitals/Pain Pain Assessment Pain Assessment: Faces Faces Pain Scale: Hurts little more Pain Location: bil knees after fall (fell directly onto her  knees) Pain Descriptors / Indicators: Aching, Grimacing Pain Intervention(s): Monitored during session    Home Living Family/patient expects to be discharged to:: Private residence Living Arrangements: Other relatives (Brother) Available Help at Discharge: Family;Available PRN/intermittently (Brother works days) Type of Home: House Home Access: Level entry (at entrance directly into her bedroom)       Home Layout: One level Home Equipment: None Additional  Comments: Pt states she would like a RW or cane    Prior Function Prior Level of Function : Independent/Modified Independent;History of Falls (last six months);Other (comment) (Reports history of seizures, and when she feels an aura, she knows to sit; Reports inability to keep her eyes steady when she turns her head quickly)             Mobility Comments: Does not typically use an assistive device (would like one); Reports this episode and other falls are without the aura, and are unpredictable ADLs Comments: Does not reorts difficulty with ADLs     Hand Dominance        Extremity/Trunk Assessment   Upper Extremity Assessment Upper Extremity Assessment: Defer to OT evaluation    Lower Extremity Assessment Lower Extremity Assessment: Generalized weakness (But not showing knee buckle in upright standing/walking in room)       Communication   Communication: No difficulties  Cognition Arousal/Alertness: Awake/alert Behavior During Therapy: WFL for tasks assessed/performed Overall Cognitive Status: Within Functional Limits for tasks assessed                                          General Comments General comments (skin integrity, edema, etc.):   11/11/21 1209  Vital Signs  Patient Position (if appropriate) Orthostatic Vitals  Orthostatic Lying   BP- Lying 91/68  Pulse- Lying 78  Orthostatic Sitting  BP- Sitting (!) 88/68  Pulse- Sitting 87  Orthostatic Standing at 0 minutes  BP- Standing at 0 minutes 102/68  Pulse- Standing at 0 minutes 78  Orthostatic Standing at 3 minutes  BP- Standing at 3 minutes 105/65  Pulse- Standing at 3 minutes 90 (after walking in the room)   BPs soft, but Orthostatics negative for significant change among supine, sit, stand, and 3 min stand    Exercises     Assessment/Plan    PT Assessment Patient needs continued PT services  PT Problem List Decreased activity tolerance;Decreased balance;Decreased  mobility;Decreased knowledge of use of DME;Decreased knowledge of precautions;Decreased coordination;Pain       PT Treatment Interventions DME instruction;Gait training;Stair training;Functional mobility training;Therapeutic activities;Therapeutic exercise;Balance training;Neuromuscular re-education;Cognitive remediation;Patient/family education    PT Goals (Current goals can be found in the Care Plan section)  Acute Rehab PT Goals Patient Stated Goal: Wants to eat PT Goal Formulation: With patient Time For Goal Achievement: 11/25/21 Potential to Achieve Goals: Good    Frequency Min 3X/week     Co-evaluation               AM-PAC PT "6 Clicks" Mobility  Outcome Measure Help needed turning from your back to your side while in a flat bed without using bedrails?: None Help needed moving from lying on your back to sitting on the side of a flat bed without using bedrails?: None Help needed moving to and from a bed to a chair (including a wheelchair)?: None Help needed standing up from a chair using your arms (e.g., wheelchair or bedside chair)?:  A Little Help needed to walk in hospital room?: A Little Help needed climbing 3-5 steps with a railing? : A Little 6 Click Score: 21    End of Session   Activity Tolerance: Patient tolerated treatment well Patient left: in chair;with call bell/phone within reach Nurse Communication: Mobility status PT Visit Diagnosis: Unsteadiness on feet (R26.81);Other abnormalities of gait and mobility (R26.89);Other symptoms and signs involving the nervous system (R29.898) (Rec closer look at Vestibular function)    Time: [id number] PT Time Calculation (min) (ACUTE ONLY): 29 min   Charges:   PT Evaluation $PT Eval Moderate Complexity: 1 Mod PT Treatments $Gait Training: 8-22 mins        Roney Marion, PT  Acute Rehabilitation Services Pager [phone] Office [place] 11/11/2021, 12:43 PM

## 2021-11-11 NOTE — Progress Notes (Signed)
Patient arrived to unit, VSS, oriented to room with call bell within reach.  Initial assessment performed, no skin issues noted.    Patient c/o pain 8/10 in her abdomen from vomiting yesterday.  No PRN to give her due to being on Methadone and Tylenol not able to be given again until 1840, reached out to provider- ordered Maalox, patient refused.  Will continue monitoring.

## 2021-11-11 NOTE — Progress Notes (Signed)
°  Echocardiogram 2D Echocardiogram has been performed.  Leta Jungling M 11/11/2021, 10:52 AM

## 2021-11-11 NOTE — Discharge Summary (Addendum)
Name: Alejandra Schneider MRN: [id number] DOB: 1956-07-06 66 y.o. PCP: Alejandra Schneider  Date of Admission: 11/10/2021  8:34 AM Date of Discharge: 11/12/2021 Attending Physician: Alejandra Killian, MD  Discharge Diagnosis: 1.  Syncope, likely vasovagal vs seizure 2.  History of seizures 3.  History of DVT with IVC filter 4.  Hypertension 5.  Acute on chronic abdominal pain 6.  Anxiety/depression 7. History of heroin use 8.  Left adrenal nodule, incidental and likely benign  Discharge Medications: Allergies as of 11/12/2021       Reactions   Diazepam Other (See Comments)   Agitation   Codeine Itching   Haldol [haloperidol Decanoate] Swelling   Penicillins Hives   Phenobarbital Itching   Pentazocine Anxiety, Nausea And Vomiting, Rash        Medication List     STOP taking these medications    Eliquis 5 MG Tabs tablet Generic drug: apixaban   phenytoin 200 MG ER capsule Commonly known as: DILANTIN       TAKE these medications    acetaminophen 500 MG tablet Commonly known as: TYLENOL Take 1,000 mg by mouth every 6 (six) hours as needed for headache.   amLODipine 5 MG tablet Commonly known as: NORVASC Take 1 tablet (5 mg total) by mouth daily. Start taking on: November 13, 2021   clonazePAM 0.5 MG tablet Commonly known as: KLONOPIN Take 1 tablet (0.5 mg total) by mouth 2 (two) times daily as needed for anxiety. What changed: when to take this   cloNIDine 0.1 MG tablet Commonly known as: CATAPRES Take 0.1 mg by mouth in the morning and at bedtime.   enoxaparin 80 MG/0.8ML injection Commonly known as: Lovenox Inject 0.8 mLs (80 mg total) into the skin every 12 (twelve) hours.   levETIRAcetam 500 MG tablet Commonly known as: Keppra Take 1 tablet (500 mg total) by mouth 2 (two) times daily.   methadone 10 MG/5ML solution Commonly known as: DOLOPHINE Take 150 mg by mouth daily.   ondansetron 4 MG disintegrating tablet Commonly  known as: ZOFRAN-ODT Take 1 tablet (4 mg total) by mouth every 8 (eight) hours as needed for nausea or vomiting.        Disposition and follow-up:   Ms.Alejandra Schneider was discharged from [facility] in Stable condition.  At the hospital follow up visit please address:  -Given interaction between phenytoin and apixaban as well as patient stating that she did not like to take either these medications, we transition her to [place] and Lovenox.  Gave her a loading dose of Keppra in the hospital, and subsequently transition her to Keppra 500 mg twice daily.  Patient stated that she was amenable to trying injections at home, therefore we started enoxaparin at discharge as well.  Address tolerance to these medications at next office visit.  The patient also states that she does not have a neurologist, therefore we have placed an ambulatory referral to neurology today for follow-up in the outpatient setting.  -Patient was severely hypertensive on arrival, in the [id number] systolic.  We continued her home clonidine and started her on amlodipine 5 mg daily while she was here.  Her blood pressure significantly improved.  We discharged her on amlodipine and clonidine regimens.  Will require further discussion with her PCP at her next office visit.  -Patient endorsed vertigo/dizziness with changes in head position.  Physical therapy is recommending for outpatient vestibular PT.  -Incidental left adrenal nodule was found on  imaging while the patient was here.  Findings suggest this is likely benign, but she will need repeat imaging for this in 12 months. Consider outpatient evaluation for functional nodule per PCP.  -Imaging also showed a malpositioned IVC filter.  No actions were taken with the exception of starting anticoagulation as stated above.  This will need to be addressed further in the outpatient setting.  -Possible Q waves in lateral leads, no regional wall motion abnormalities or  cardiac symptoms during hospitalization. Consider cardiology referral for further evaluation.  2.  Labs / imaging needed at time of follow-up: BMP for monitoring of kidney function and electrolytes.  3.  Pending labs/ test needing follow-up: None  Follow-up Appointments:  Follow-up [facility] Follow up.   Specialty: Rehabilitation Why: for vestibular PT Contact information: [place]. [id number] [place] Hospital Course by problem list:  Syncope, likely vasovagal vs less likely seizure Pt presents after a syncopal episode with nausea and several episodes of vomiting prior to the event. This happened at her methadone clinic and was witnessed by security guard.  She denies urinary or bowel incontinence, no witnessed shaking, no tongue lacerations noted by EMS.   On initial evaluation patient was quite hypertensive although otherwise hemodynamically stable.  Exam with some left upper quadrant tenderness.  EKG showing sinus arrhythmia and possible Q waves in lateral leads.  BMP and CBC unremarkable.  Troponin negative x1.  Lactic acid of 2.1.  Lipase of 36.  Orthostatics were negative. CT head obtained in the ED, unremarkable.  Chest x-ray with mild cardiomegaly and no other acute abnormalities.  CTA abdomen pelvis without acute abnormality with known hiatal hernia seen on this exam without evidence of obstruction or strangulation.  Incidental adrenal nodule seen recommending 58-month follow-up.  CTA chest without acute findings including PE.  Patient received 1 L fluid, as well as home clonidine in addition to amlodipine and 1 dose of IV hydralazine per ED provider.  Syncopal event suspicious for vasovagal syncope given reported preceding abdominal pain, nausea, vomiting, and prodromal symptoms. Also considered seizure given undetectable phenytoin levels and patient  reported absence of memory of events following syncopal episode. Factors making seizure less likely are no convulsions noted by EMS and no incontinence, tongue biting, etc. An echocardiogram was obtained due to abnormal EKG findings, but this was found to be unremarkable, thus making cardiogenic causes less likely. Telemetry was reviewed during inpatient stay and no evidence of arrhythmia noted.  Otherwise, while the patient was in the hospital, we encouraged PO intake, and the patient did not have any recurring episodes of vomiting or syncope.  She was discharged in stable condition.   H/o seizures, on phenytoin at home Patient endorses compliance with phenytoin, however her levels were very low on admission.  As mentioned below, patient is also on Eliquis for history of DVTs, though her overall compliance with these medications is unclear.  She does not have an outpatient neurologist currently.  We discussed this further with neurology on day of discharge, and they recommended giving a loading dose of Keppra followed by maintenance Keppra twice daily.  Patient was discharged on Keppra 500 mg twice daily for seizures.  Additionally, we discontinued her home phenytoin and apixaban given interactions between these 2 medications as well as patient stating she does not like taking these two  medications at home.  She will follow-up with neurology in the outpatient setting. Of note, patient does not drive.   H/o DVT with IVC filter, previously not on [facility] Patient with history of DVTs, most recently in December 2022.  She had an IVC filter placed in the 1990s, and it is found to be malpositioned on CT imaging obtained here.  The patient states that she was on Eliquis for period of 2 weeks in December, however she stopped taking it when she started feeling better.  While she was in the hospital, we discontinued her apixaban and switched her to Lovenox.  Patient endorses being comfortable with giving daily injections  and stated that she wanted to discontinue her apixaban anyway.  Discharged on Lovenox 1 mg/kg twice daily. Will need to address malpositioned IVC filter as well as duration of anticoagulation in the outpatient setting.   Vertigo Physical therapy evaluated the patient during this hospitalization for episode of her syncope.  Patient endorsed feelings of vertigo/dizziness with changes in head position.  Denied orthostasis. Physical therapy worked with her for two sessions while she was here, and they recommended outpatient PT for gait and balance dysfunction.   Severe hypertension, resolved during this hospitalization Pt endorses taking clonidine at home for HTN. Review of records shows that pt was also prescribed lisinopril 20 mg daily and HCTZ 25 mg daily. Patient states that she has not taken these latter 2 medications at home.  Blood pressure on arrival was in the [id number] systolic in the room.  At that time, we resumed her home clonidine and started amlodipine 5 mg daily.  Her blood pressure is much improved to the [id number] systolic while she was here.  She was discharged on this regimen with recommendation to discuss this further with her primary care provider.    Acute on chronic abdominal pain Patient has a large hiatal hernia noted on prior CT scans. Most recent CT scan shows a type IV hiatal hernia with no signs of obstruction or strangulation. Lipase levels are within normal limits.  The patient endorsed improvement of her abdominal pain throughout the hospitalization, and she was able to increase her p.o. intake without recurrent episodes of nausea or vomiting.  She was discharged on Zofran to take as needed for nausea.    Anxiety Depression We continued her home clonazepam 0.5 mg daily PRN.   H/o heroin use Denies current use. Attends the methadone clinic daily. We continued her home methadone 150 mg po daily while she was here.   Left adrenal nodule Incidental finding on CT imaging here.  Likely benign. Will need 12 month f/u with CT/MRI in the outpatient setting. Discuss need for functional nodule testing with PCP.   Discharge Exam:   BP 117/81 (BP Location: Left Arm)    Pulse 93    Temp 97.9 F (36.6 C) (Oral)    Resp 17    Ht 5' (1.524 m)    Wt 77.9 kg    SpO2 95%    BMI 33.53 kg/m  Discharge exam:  General: NAD, nl appearance HE: Normocephalic, atraumatic, EOMI, Conjunctivae normal ENT: No congestion, no rhinorrhea, no exudate or erythema  Cardiovascular: Normal rate, regular rhythm. I/VI systolic ejection murmur at RUSB. No rubs or gallops Pulmonary: Effort normal, breath sounds normal. No wheezes, rales, or rhonchi Abdominal: soft, nondistended Musculoskeletal: no swelling, deformity, injury or tenderness in extremities Skin: Warm, dry, no bruising, erythema, or rash Psychiatric/Behavioral: normal mood, normal behavior    Pertinent Labs, Studies,  and Procedures:  CBC Latest Ref Rng & Units 11/12/2021 11/11/2021 11/10/2021  WBC 4.0 - 10.5 K/uL 3.3(L) 4.7 5.2  Hemoglobin 12.0 - 15.0 g/dL 11.7(L) 12.7 14.9  Hematocrit 36.0 - 46.0 % 38.0 40.5 48.2(H)  Platelets 150 - 400 K/uL 156 167 191   BMP Latest Ref Rng & Units 11/12/2021 11/11/2021 11/10/2021  Glucose 70 - 99 mg/dL 160(H) 110(H) 205(H)  BUN 8 - 23 mg/dL 20 8 10   Creatinine 0.44 - 1.00 mg/dL 1.02(H) 0.74 0.84  Sodium 135 - 145 mmol/L 133(L) 137 139  Potassium 3.5 - 5.1 mmol/L 3.9 3.8 3.6  Chloride 98 - 111 mmol/L 97(L) 99 97(L)  CO2 22 - 32 mmol/L 29 30 28   Calcium 8.9 - 10.3 mg/dL 8.7(L) 9.1 9.7   CT Head Wo Contrast  Result Date: 11/10/2021 CLINICAL DATA:  Syncope, dizziness history of seizure EXAM: CT HEAD WITHOUT CONTRAST TECHNIQUE: Contiguous axial images were obtained from the base of the skull through the vertex without intravenous contrast. RADIATION DOSE REDUCTION: This exam was performed according to the departmental dose-optimization program which includes automated exposure control, adjustment of the  mA and/or kV according to patient size and/or use of iterative reconstruction technique. COMPARISON:  CT head 12/12/2003 FINDINGS: Brain: There is no evidence of acute intracranial hemorrhage, extra-axial fluid collection, or acute infarct. Parenchymal volume is normal. The ventricles are normal in size. There is no significant burden of white matter microangiopathic change. There is no mass lesion. There is no midline shift. Vascular: There is mild calcification of the intracranial ICAs. No dense vessel is seen. Skull: Normal. Negative for fracture or focal lesion. Sinuses/Orbits: The paranasal sinuses are clear. The globes and orbits are unremarkable. Other: None. IMPRESSION: Unremarkable head CT with no acute intracranial pathology. Electronically Signed   By: Valetta Mole M.D.   On: 11/10/2021 10:21   CT Angio Chest Pulmonary Embolism (PE) W or WO Contrast  Result Date: 11/10/2021 CLINICAL DATA:  Syncopal episodes. EXAM: CT ANGIOGRAPHY CHEST WITH CONTRAST TECHNIQUE: Multidetector CT imaging of the chest was performed using the standard protocol during bolus administration of intravenous contrast. Multiplanar CT image reconstructions and MIPs were obtained to evaluate the vascular anatomy. RADIATION DOSE REDUCTION: This exam was performed according to the departmental dose-optimization program which includes automated exposure control, adjustment of the mA and/or kV according to patient size and/or use of iterative reconstruction technique. CONTRAST:  75mL OMNIPAQUE IOHEXOL 350 MG/ML SOLN COMPARISON:  CTA abdomen and pelvis earlier today, and report of the most recent CTA chest dated 11/03/2005, for which images are unavailable at this time. FINDINGS: Cardiovascular: Satisfactory opacification of the pulmonary arteries to the segmental level. No evidence of pulmonary embolism. There is mild cardiomegaly. No pericardial effusion. There are trace coronary artery calcifications. Patchy aortic calcific  atherosclerosis without aneurysm or dissection. The great vessels are normal caliber with normal variant brachiobicarotid trunk, minimal calcification in the aortic branch arteries. Mediastinum/Nodes: Large hiatal hernia extends up to the level of the carina, with intrathoracic stomach and mid transverse colon. There is inversion of the gastric fundus consistent with an organoaxial gastric positioning. The gastric wall is contracted, and the herniated stomach is in the right side of the hernia. There is no thyroid mass no intrathoracic adenopathy is seen. No significant esophageal thickening is evident. There is no tracheal or main airway filling defect. Lungs/Pleura: There is compressive atelectasis adjacent to the large hiatal hernia in the medial lower lobes. Posterior basal linear atelectasis in both lower lobes. No  pulmonary infiltrate or nodule is seen. There is no pleural effusion, thickening or pneumothorax. Upper Abdomen: No acute abnormality. Musculoskeletal: At T7, there are prominent right-sided facet osteophytes impressing on the right dorsal lateral aspect of the thecal sac and displacing the cord into the left hemicanal with borderline/slight cord compression. Right paracentral disc osteophyte complex at T8-9 and dorsal ligamentous ossification also efface the right hemicanal and at least slightly compress the cord. At T9-10, and T10-11 there is additional dorsal ligamentous ossification moderately effacing the thecal sac from posteriorly. There is mild thoracic kypholevoscoliosis. Review of the MIP images confirms the above findings. IMPRESSION: 1. No acute chest CT or CTA findings. 2. Chronic large hiatal hernia with intrathoracic stomach and mid transverse colon. 3. Aortic atherosclerosis, trace calcific CAD and mild cardiomegaly. No venous distention or edema , no pericardial effusion. 4. Four lower thoracic levels with encroachment on the thecal sac as detailed above. Please correlate clinically.  Electronically Signed   By: Telford Nab M.D.   On: 11/10/2021 21:39   DG Chest Portable 1 View  Result Date: 11/10/2021 CLINICAL DATA:  Epigastric abdominal pain. EXAM: PORTABLE CHEST 1 VIEW COMPARISON:  None. FINDINGS: Mildly enlarged heart, accentuated on the right due to patient rotation to the right. Left-sided aortic arch with calcifications. Clear lungs with normal vascularity. Mild bilateral glenohumeral joint degenerative changes. IMPRESSION: Mild cardiomegaly.  No acute abnormality. Electronically Signed   By: Claudie Revering M.D.   On: 11/10/2021 11:55   ECHOCARDIOGRAM COMPLETE  Result Date: 11/11/2021    ECHOCARDIOGRAM REPORT   Patient Name:   AURIAH OAKDEN Date of Exam: 11/11/2021 Medical Rec #:  [id number]       Height:       60.0 in Accession #:    [id number]      Weight:       178.0 lb Date of Birth:  [birth year]      BSA:          1.776 m Patient Age:    43 years        BP:           126/80 mmHg Patient Gender: F               HR:           97 bpm. Exam Location:  Inpatient Procedure: 2D Echo, Cardiac Doppler, Color Doppler and Intracardiac            Opacification Agent Indications:    Abnormal ECG R94.31  History:        Patient has prior history of Echocardiogram examinations, most                 recent 12/17/2019. Risk Factors:Hypertension. Chronic DVT with                 IVC filter. Syncope and collapse.  Sonographer:    Darlina Sicilian RDCS Referring Phys: [id number] GRACE LAU  Sonographer Comments: Suboptimal subcostal window. IMPRESSIONS  1. Left ventricular ejection fraction, by estimation, is 65 to 70%. The left ventricle has normal function. The left ventricle has no regional wall motion abnormalities. There is moderate concentric left ventricular hypertrophy. Left ventricular diastolic parameters are consistent with Grade II diastolic dysfunction (pseudonormalization). Elevated left atrial pressure. Left ventricle is small.  2. Right ventricular systolic function is normal. The right  ventricular size is normal. There is normal pulmonary artery systolic pressure. The estimated right ventricular systolic pressure is [id number] mmHg.  3. The  mitral valve is normal in structure. Mild mitral valve regurgitation. No evidence of mitral stenosis.  4. The aortic valve is tricuspid. Aortic valve regurgitation is not visualized.  5. Tricuspid valve regurgitation is mild to moderate and eccentric in nature- jet directed toward the septum. Comparison(s): Prior images unable to be directly viewed, comparison made by report only. Similar to prior OSH echo report. FINDINGS  Left Ventricle: Left ventricular ejection fraction, by estimation, is 65 to 70%. The left ventricle has normal function. The left ventricle has no regional wall motion abnormalities. Definity contrast agent was given IV to delineate the left ventricular  endocardial borders. The left ventricular internal cavity size was small. There is moderate concentric left ventricular hypertrophy. Left ventricular diastolic parameters are consistent with Grade II diastolic dysfunction (pseudonormalization). Elevated  left atrial pressure. Right Ventricle: The right ventricular size is normal. No increase in right ventricular wall thickness. Right ventricular systolic function is normal. There is normal pulmonary artery systolic pressure. The tricuspid regurgitant velocity is 1.99 m/s, and  with an assumed right atrial pressure of 8 mmHg, the estimated right ventricular systolic pressure is 23.8 mmHg. Left Atrium: Left atrial size was normal in size. Right Atrium: Right atrial size was normal in size. Pericardium: There is no evidence of pericardial effusion. Mitral Valve: The mitral valve is normal in structure. Mild mitral valve regurgitation. No evidence of mitral valve stenosis. Tricuspid Valve: Eccentric. The tricuspid valve is normal in structure. Tricuspid valve regurgitation is mild to moderate. No evidence of tricuspid stenosis. Aortic Valve: The  aortic valve is tricuspid. Aortic valve regurgitation is not visualized. Pulmonic Valve: The pulmonic valve was normal in structure. Pulmonic valve regurgitation is not visualized. No evidence of pulmonic stenosis. Aorta: The aortic root and ascending aorta are structurally normal, with no evidence of dilitation. IAS/Shunts: No atrial level shunt detected by color flow Doppler.  LEFT VENTRICLE PLAX 2D LVIDd:         1.90 cm     Diastology LVIDs:         1.30 cm     LV e' medial:    3.90 cm/s LV PW:         1.10 cm     LV E/e' medial:  18.3 LV IVS:        1.10 cm     LV e' lateral:   3.15 cm/s LVOT diam:     1.80 cm     LV E/e' lateral: 22.6 LV SV:         52 LV SV Index:   29 LVOT Area:     2.54 cm  LV Volumes (MOD) LV vol d, MOD A2C: 61.5 ml LV vol d, MOD A4C: 67.7 ml LV vol s, MOD A2C: 18.5 ml LV vol s, MOD A4C: 14.9 ml LV SV MOD A2C:     43.0 ml LV SV MOD A4C:     67.7 ml LV SV MOD BP:      47.5 ml LEFT ATRIUM             Index LA diam:        1.40 cm 0.79 cm/m LA Vol (A2C):   14.4 ml 8.11 ml/m LA Vol (A4C):   32.3 ml 18.18 ml/m LA Biplane Vol: 21.7 ml 12.22 ml/m  AORTIC VALVE LVOT Vmax:   119.00 cm/s LVOT Vmean:  79.600 cm/s LVOT VTI:    0.203 m  AORTA Ao Root diam: 3.00 cm Ao Asc diam:  2.70 cm MITRAL  VALVE               TRICUSPID VALVE MV Area (PHT): 3.60 cm    TR Peak grad:   15.8 mmHg MV Decel Time: 211 msec    TR Vmax:        199.00 cm/s MV E velocity: 71.30 cm/s MV A velocity: 50.70 cm/s  SHUNTS MV E/A ratio:  1.41        Systemic VTI:  0.20 m                            Systemic Diam: 1.80 cm Rudean Haskell MD Electronically signed by Rudean Haskell MD Signature Date/Time: 11/11/2021/12:24:20 PM    Final    CT Angio Abd/Pel W and/or Wo Contrast  Result Date: 11/10/2021 CLINICAL DATA:  66 year old female with history of syncopal episode, abdominal pain. EXAM: CT ANGIOGRAPHY ABDOMEN AND PELVIS WITH CONTRAST AND WITHOUT CONTRAST TECHNIQUE: Multidetector CT imaging of the abdomen and  pelvis was performed using the standard protocol during bolus administration of intravenous contrast. Multiplanar reconstructed images and MIPs were obtained and reviewed to evaluate the vascular anatomy. CONTRAST:  192mL OMNIPAQUE IOHEXOL 350 MG/ML SOLN COMPARISON:  11/08/2008 FINDINGS: VASCULAR Aorta: Atherosclerosis without signficant stenosis, dissection, or aneurysm. Celiac: Patent without evidence of aneurysm, dissection, vasculitis or significant stenosis. SMA: Patent without evidence of aneurysm, dissection, vasculitis or significant stenosis. Renals: Dual right and single left renal arteries are patent without evidence of aneurysm, dissection, vasculitis, fibromuscular dysplasia or significant stenosis. IMA: Patent without evidence of aneurysm, dissection, vasculitis or significant stenosis. Inflow: Patent without evidence of aneurysm, dissection, vasculitis or significant stenosis. Proximal Outflow: Bilateral common femoral and visualized portions of the superficial and profunda femoral arteries are patent without evidence of aneurysm, dissection, vasculitis or significant stenosis. Veins: The hepatic veins are widely patent. The portal system is widely patent and normal in caliber. The renal veins are patent bilaterally in standard anatomic configuration. Infrarenal Bard Simon Nitinol inferior vena cava filter in place with extraluminal position of multiple struts which extend into the abdominal aorta, duodenum, and pericaval retroperitoneal fat. No evidence of iliocaval thrombosis or anomaly. Review of the MIP images confirms the above findings. NON-VASCULAR Lower chest: No acute abnormality. Hepatobiliary: No focal liver abnormality is seen. Status post cholecystectomy. No biliary dilatation. Pancreas: Unremarkable. No pancreatic ductal dilatation or surrounding inflammatory changes. Spleen: Normal in size without focal abnormality. Adrenals/Urinary Tract: Hypoattenuating left adrenal nodule measuring  up to 1.4 cm. The right adrenal gland is within normal limits. Again seen are multifocal bilateral simple renal cysts, now more numerous than 2010 comparison, the largest exophytic about the right interpolar region measuring up to 1.8 cm. No hydronephrosis. No nephrolithiasis. Stomach/Bowel: Incompletely evaluated type 4 hiatal hernia which includes a loop of transverse colon. The appendix is not definitively identified. No evidence of bowel wall thickening or dilation. Lymphatic: No abdominopelvic lymphadenopathy. Reproductive: Uterus and bilateral adnexa are unremarkable. Other: No abdominal wall hernia or abnormality. No abdominopelvic ascites. Musculoskeletal: No acute osseous abnormality. Advanced degenerative changes of the hips bilaterally, right greater than left. IMPRESSION: VASCULAR 1. No acute vascular abnormality. Specifically, no evidence of acute or chronic mesenteric ischemia. 2. Malpositioned chronic indwelling infrarenal IVC filter, unchanged from 20/10 comparison. NON-VASCULAR 1. No acute abdominopelvic abnormality. 2. Incompletely evaluated type 4 hiatal hernia. No evidence of obstruction or strangulation. Chest CT could be obtained for further characterization as clinically indicated. 3. Interval development of indeterminate but likely benign  left adrenal nodule measuring up to 1.4 cm. Twelve month follow-up CT or MRI adrenal mass protocol could be considered for surveillance. ([employer], [employer], Boland GL, Francis IR, [place] GM, [place] PJ, Roberts LL, Pandharipande [place]. Management of Incidental Adrenal Masses: A White Paper of the ACR Incidental Findings Committee. J Am Coll Radiol. 2017 Aug;14(8):1038-1044. doi: 10.1016/j.jacr.2017.05.001. Epub 2017 Jun 23. PMID: [id number].) Ruthann Cancer, MD [facility] Radiology Electronically Signed   By: Ruthann Cancer M.D.   On: 11/10/2021 15:46     Discharge Instructions: Discharge Instructions      Ambulatory referral to Neurology   Complete by: As directed    Patient was previously on phenytoin but transitioned to [place] while in the hospital.  She does not have an outpatient neurologist currently.   Ambulatory referral to Physical Therapy   Complete by: As directed    Outpatient vestibular PT   Call MD for:  difficulty breathing, headache or visual disturbances   Complete by: As directed    Call MD for:  persistant dizziness or light-headedness   Complete by: As directed    Call MD for:  persistant nausea and vomiting   Complete by: As directed    Call MD for:  temperature >100.4   Complete by: As directed    Diet - low sodium heart healthy   Complete by: As directed        Signed: Orvis Brill, MD 11/12/2021, 3:11 PM   Pager: [phone]

## 2021-11-11 NOTE — Progress Notes (Signed)
Subjective: The patient was seen at bedside during rounds this AM. She states that she does not recall anything from yesterday, including our conversations. States that she started vomiting several times while she was on her way to the methadone clinic. She passed out next to one of the security guards at the clinic. States that she took Eliquis for a period of two weeks last month after her last DVT in December. However, she otherwise has not been taking this at home. States that she is on clonidine for her HTN, but she does not take HCTZ or lisinopril at home. No other complaints or concerns at this time.   Objective:  Vital signs in last 24 hours: Vitals:   11/10/21 2200 11/10/21 2245 11/11/21 0147 11/11/21 0521  BP: (!) 173/157 113/78 137/88 (!) 154/80  Pulse: (!) 109 (!) 108 79 87  Resp: 15 20 17 20   Temp:      TempSrc:      SpO2:  93% 93% 97%  Weight:      Height:       General: NAD, nl appearance HE: Normocephalic, atraumatic, EOMI, Conjunctivae normal ENT: No congestion, no rhinorrhea, no exudate or erythema  Cardiovascular: Normal rate, regular rhythm. I/VI systolic ejection murmur at RUSB. No rubs or gallops Pulmonary: Effort normal, breath sounds normal. No wheezes, rales, or rhonchi Abdominal: soft, nondistended Musculoskeletal: no swelling, deformity, injury or tenderness in extremities Skin: Warm, dry, no bruising, erythema, or rash Psychiatric/Behavioral: normal mood, normal behavior    Assessment/Plan:  Principal Problem:   Syncope and collapse  Alejandra Schneider is a 66 y.o. 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  Syncope, vasovagal versus cardiogenic Pt presents after a syncopal episode with nausea and several episodes of vomiting prior to the event.  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  Imaging showed a malpositioned chronic indwelling IVC filter, but otherwise no evidence of PE on CT angio chest.  Troponins are normal.  Suspicious for vasovagal syncope given decreased p.o. intake recently and prodromal symptoms, however we will also obtain an echocardiogram for further evaluation given abnormal EKG findings and the presence of a systolic murmur on exam.  Her clinical picture is less likely suggestive of a seizure given no convulsions noted by EMS and with a quick return to baseline, though her phenytoin levels were low today (see below).  -Encourage p.o. intake -Echocardiogram pending -Orthostatic vital signs -Trend CBC, BMP -Telemetry  H/o DVT with IVC filter, previously not on [facility] Patient with history of DVTs, most recently in December 2022.  She had an IVC filter placed in the 90s, and it is found to be malpositioned on CT imaging obtained here.  The patient states that she was on Eliquis for period of 2 weeks in December, however she stopped taking it when she started feeling better. -Continue Eliquis 5 mg p.o. twice daily -We will need to address malpositioned IVC filter in the outpatient setting  H/o seizures on phenytoin Patient endorses compliance with phenytoin, however her levels were low today. -Continue phenytoin 200 mg p.o. twice daily  HTN, improving Pt endorses taking clonidine at home for HTN. Review of records shows that pt is also on lisinopril 20 mg daily and HCTZ 25 mg daily. Patient states that she has not taken these latter 2 medications at home.  BP much improved today, at 126/80 currently. -Continue amlodipine 5 mg daily  -Continue clonidine 0.1 mg PO BID  Acute on chronic abdominal pain Patient has a large hiatal hernia noted on prior CT scans. Most recent CT scan today shows a type IV hiatal hernia with no signs of obstruction or strangulation. Lipase levels are within normal limits. Today, the patient says that her pain has much  improved since her vomiting started a couple days ago.  She is willing to increase her PO intake now. -Encourage p.o. intake -Zofran PRN  Anxiety Depression -Continue clonazepam 0.5 mg daily PRN   H/o heroin use Denies current use. Attends the methadone clinic daily. -Continue methadone 150 mg daily  Left adrenal nodule Incidental finding on CT imaging here. Likely benign. Will need 12 month f/u with CT/MRI in the outpatient setting.  Prior to Admission Living Arrangement: Home Anticipated Discharge Location: Home Barriers to Discharge: Medical stability Dispo: Anticipated discharge in approximately 1-2 day(s).   Orvis Brill, MD 11/11/2021, 7:16 AM Pager: [phone]  After 5pm on weekdays and 1pm on weekends: On Call pager [phone]

## 2021-11-12 ENCOUNTER — Other Ambulatory Visit (HOSPITAL_COMMUNITY): Payer: Self-pay

## 2021-11-12 ENCOUNTER — Encounter (HOSPITAL_COMMUNITY): Payer: Self-pay | Admitting: Pharmacist Clinician (PhC)/ Clinical Pharmacy Specialist

## 2021-11-12 DIAGNOSIS — Z86718 Personal history of other venous thrombosis and embolism: Secondary | ICD-10-CM

## 2021-11-12 DIAGNOSIS — R55 Syncope and collapse: Principal | ICD-10-CM

## 2021-11-12 DIAGNOSIS — F32A Depression, unspecified: Secondary | ICD-10-CM

## 2021-11-12 DIAGNOSIS — I10 Essential (primary) hypertension: Secondary | ICD-10-CM

## 2021-11-12 DIAGNOSIS — F419 Anxiety disorder, unspecified: Secondary | ICD-10-CM

## 2021-11-12 DIAGNOSIS — F1190 Opioid use, unspecified, uncomplicated: Secondary | ICD-10-CM

## 2021-11-12 DIAGNOSIS — R109 Unspecified abdominal pain: Secondary | ICD-10-CM

## 2021-11-12 DIAGNOSIS — G8929 Other chronic pain: Secondary | ICD-10-CM

## 2021-11-12 LAB — BASIC METABOLIC PANEL
Anion gap: 7 (ref 5–15)
BUN: 20 mg/dL (ref 8–23)
CO2: 29 mmol/L (ref 22–32)
Calcium: 8.7 mg/dL — ABNORMAL LOW (ref 8.9–10.3)
Chloride: 97 mmol/L — ABNORMAL LOW (ref 98–111)
Creatinine, Ser: 1.02 mg/dL — ABNORMAL HIGH (ref 0.44–1.00)
GFR, Estimated: >60 mL/min (ref >60)
Glucose, Bld: 160 mg/dL — ABNORMAL HIGH (ref 70–99)
Potassium: 3.9 mmol/L (ref 3.5–5.1)
Sodium: 133 mmol/L — ABNORMAL LOW (ref 135–145)

## 2021-11-12 LAB — CBC
HCT: 38 % (ref 36.0–46.0)
Hemoglobin: 11.7 g/dL — ABNORMAL LOW (ref 12.0–15.0)
MCH: 25.7 pg — ABNORMAL LOW (ref 26.0–34.0)
MCHC: 30.8 g/dL (ref 30.0–36.0)
MCV: 83.3 fL (ref 80.0–100.0)
Platelets: 156 10*3/uL (ref 150–400)
RBC: 4.56 MIL/uL (ref 3.87–5.11)
RDW: 15.8 % — ABNORMAL HIGH (ref 11.5–15.5)
WBC: 3.3 10*3/uL — ABNORMAL LOW (ref 4.0–10.5)
nRBC: 0 % (ref 0.0–0.2)

## 2021-11-12 LAB — GLUCOSE, CAPILLARY: Glucose-Capillary: 97 mg/dL (ref 70–99)

## 2021-11-12 MED ORDER — AMLODIPINE BESYLATE 5 MG PO TABS: 5.0000 mg | ORAL_TABLET | Freq: Every day | ORAL | 0 refills | Status: DC

## 2021-11-12 MED ORDER — AMLODIPINE BESYLATE 5 MG PO TABS
5.0000 mg | ORAL_TABLET | Freq: Every day | ORAL | 0 refills | Status: DC
  Filled 2021-11-12: qty 30, 30d supply, fill #0

## 2021-11-12 MED ORDER — LEVETIRACETAM IN NACL 1000 MG/100ML IV SOLN: 1000.0000 mg | Freq: Once | INTRAVENOUS | Status: DC

## 2021-11-12 MED ORDER — LEVETIRACETAM IN NACL 1000 MG/100ML IV SOLN
1000.0000 mg | Freq: Once | INTRAVENOUS | Status: AC
  Administered 2021-11-12: 15:00:00 1000 mg via INTRAVENOUS
  Filled 2021-11-12 (×2): qty 100

## 2021-11-12 MED ORDER — ENOXAPARIN SODIUM 80 MG/0.8ML IJ SOSY
80.0000 mg | PREFILLED_SYRINGE | Freq: Two times a day (BID) | INTRAMUSCULAR | 0 refills | Status: DC
Start: 2021-11-12 — End: 2024-06-28

## 2021-11-12 MED ORDER — LEVETIRACETAM 500 MG PO TABS: 500.0000 mg | ORAL_TABLET | Freq: Two times a day (BID) | ORAL | 0 refills | Status: DC

## 2021-11-12 MED ORDER — PHENYTOIN SODIUM EXTENDED 100 MG PO CAPS: 200.0000 mg | ORAL_CAPSULE | Freq: Two times a day (BID) | ORAL | Status: DC

## 2021-11-12 NOTE — Progress Notes (Shared)
Subjective: The patient was seen at bedside during rounds this AM. Endorses feeling dizzy especially with head movements. Does not have an outpatient neurologist. Sometimes takes phenytoin once a day at home, sometimes twice. Does not like taking eliquis nor phenytoin. No other complaints or concerns at this time.   Objective:  Vital signs in last 24 hours: Vitals:   11/12/21 0011 11/12/21 0539 11/12/21 0802 11/12/21 1122  BP: 115/75 137/85 122/84 117/81  Pulse: 77 81 93 93  Resp: 17 20 15 17   Temp: 97.8 F (36.6 C) 97.6 F (36.4 C) 97.9 F (36.6 C)   TempSrc: Oral Oral Oral   SpO2: 95% 95%  95%  Weight:    77.9 kg  Height:       General: NAD, nl appearance HE: Normocephalic, atraumatic, EOMI, Conjunctivae normal ENT: No congestion, no rhinorrhea, no exudate or erythema  Cardiovascular: Normal rate, regular rhythm. I/VI systolic ejection murmur at RUSB. No rubs or gallops Pulmonary: Effort normal, breath sounds normal. No wheezes, rales, or rhonchi Abdominal: soft, nondistended Musculoskeletal: no swelling, deformity, injury or tenderness in extremities Skin: Warm, dry, no bruising, erythema, or rash Psychiatric/Behavioral: normal mood, normal behavior    Assessment/Plan:  Principal Problem:   Syncope and collapse Active Problems:   Syncope  Alejandra Schneider is a 66 y.o. 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  Syncope, vasovagal versus cardiogenic Pt presents after a syncopal episode with nausea and several episodes of vomiting prior to the event.  Notable work-up includes EKG showing sinus arrhythmia and old lateral infarct.  Imaging showed a malpositioned chronic indwelling IVC filter, but otherwise no evidence of PE on CT angio chest.  Troponins are normal.  Suspicious for vasovagal syncope given decreased p.o. intake recently  and prodromal symptoms, however we will also obtain an echocardiogram for further evaluation given abnormal EKG findings and the presence of a systolic murmur on exam.  Her clinical picture is less likely suggestive of a seizure given no convulsions noted by EMS and with a quick return to baseline, though her phenytoin levels were low today (see below).  -Encourage p.o. intake -Echocardiogram pending -Orthostatic vital signs -Trend CBC, BMP -Telemetry  H/o DVT with IVC filter, previously not on [facility] Patient with history of DVTs, most recently in December 2022.  She had an IVC filter placed in the 90s, and it is found to be malpositioned on CT imaging obtained here.  The patient states that she was on Eliquis for period of 2 weeks in December, however she stopped taking it when she started feeling better. -Continue Eliquis 5 mg p.o. twice daily -We will need to address malpositioned IVC filter in the outpatient setting  H/o seizures on phenytoin Patient endorses compliance with phenytoin, however her levels were low today. -Continue phenytoin 200 mg p.o. twice daily  HTN, improving Pt endorses taking clonidine at home for HTN. Review of records shows that pt is also on lisinopril 20 mg daily and HCTZ 25 mg daily. Patient states that she has not taken these latter 2 medications at home.  BP much improved today, at 126/80 currently. -Continue amlodipine 5 mg daily  -Continue clonidine 0.1 mg PO BID  Acute on chronic abdominal pain Patient has a large hiatal hernia noted on prior CT scans. Most recent CT scan today shows a type IV hiatal hernia with no  signs of obstruction or strangulation. Lipase levels are within normal limits. Today, the patient says that her pain has much improved since her vomiting started a couple days ago.  She is willing to increase her PO intake now. -Encourage p.o. intake -Zofran PRN  Anxiety Depression -Continue clonazepam 0.5 mg daily PRN   H/o heroin use Denies  current use. Attends the methadone clinic daily. -Continue methadone 150 mg daily  Left adrenal nodule Incidental finding on CT imaging here. Likely benign. Will need 12 month f/u with CT/MRI in the outpatient setting.  Prior to Admission Living Arrangement: Home Anticipated Discharge Location: Home Barriers to Discharge: Medical stability Dispo: Anticipated discharge in approximately 1-2 day(s).   Andrey Campanile, MD 11/12/2021, 3:22 PM Pager: [phone]  After 5pm on weekdays and 1pm on weekends: On Call pager [phone]

## 2021-11-12 NOTE — Plan of Care (Signed)
ON CALL PHONE CONSULT  Call from Dr Cathleen Corti @9 :71 AM. Patient with a known history of seizures, brought in for evaluation of syncope versus seizure.  No witnessed seizure or tongue bite. On phenytoin at home-level undetectable. Also on Eliquis. Recommended not using phenytoin with Eliquis due to interaction. Recommended load of Keppra 1 g IV x1 followed by 500 twice daily. Outpatient neurology follow-up. Seizure precautions  -- Amie Portland, MD Neurologist [facility] Pager: [phone]

## 2021-11-12 NOTE — TOC Transition Note (Signed)
Transition of Care [facility]) - CM/SW Discharge Note   Patient Details  Name: Alejandra Schneider MRN: [id number] Date of Birth: [birth year]  [facility]) CM/SW Contact:  Lawerance Sabal, RN Phone Number: 11/12/2021, 2:50 PM   Clinical Narrative:   reviewed list of PT centers in [place] w patient, none take her insurance. Offered to make referral to [facility] as this is closest, and referral has already been placed, order would be duplicate. No other TOC needs for DC    Final next level of care: Home/Self Care     Patient Goals and CMS Choice        Discharge Placement                       Discharge Plan and Services                                     Social Determinants of Health (SDOH) Interventions     Readmission Risk Interventions No flowsheet data found.

## 2021-11-12 NOTE — Progress Notes (Signed)
Physical Therapy Treatment Patient Details Name: Alejandra Schneider MRN: [id number] DOB: June 22, 1956 Today's Date: 11/12/2021   History of Present Illness Alejandra Schneider is a 66 y.o. 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 PT performs vestibular assessment which is notable for corrective saccades with VOR as well as slowed saccadic movement. Pt does not reports symptoms during assessment. Pt will benefit from outpatient vestibular PT to improve stability with changes in head position and to reduce falls risk.   Recommendations for follow up therapy are one component of a multi-disciplinary discharge planning process, led by the attending physician.  Recommendations may be updated based on patient status, additional functional criteria and insurance authorization.  Follow Up Recommendations  Outpatient PT (outpatient vestibular PT)     Assistance Recommended at Discharge PRN  Patient can return home with the following A little help with walking and/or transfers   Equipment Recommendations  None recommended by PT    Recommendations for Other Services       Precautions / Restrictions Precautions Precautions: Fall Restrictions Weight Bearing Restrictions: No     Mobility  Bed Mobility                    Transfers Overall transfer level: Needs assistance Equipment used: None Transfers: Sit to/from Stand Sit to Stand: Supervision                Ambulation/Gait Ambulation/Gait assistance: Supervision Gait Distance (Feet): 300 Feet Assistive device: None Gait Pattern/deviations: Step-through pattern Gait velocity: reduced Gait velocity interpretation: 1.31 - 2.62 ft/sec, indicative of limited community ambulator   General  Gait Details: pt with slowed step-through gait, reduced stance time on RLE   Stairs             Wheelchair Mobility    Modified Rankin (Stroke Patients Only)       Balance Overall balance assessment: Mild deficits observed, not formally tested                                          Cognition Arousal/Alertness: Awake/alert Behavior During Therapy: WFL for tasks assessed/performed Overall Cognitive Status: Within Functional Limits for tasks assessed                                          Exercises Other Exercises Other Exercises: VOR x 1 vertical and horizontal Other Exercises: VOR cancellation Other Exercises: Horizontal saccades    General Comments General comments (skin integrity, edema, etc.): Vestibular assessment: pursuits WFL, saccades slowed, convergence- L eye unable to converge within ~4 inches and laterally deviates. Pt with corrective saccades during VOR horizontal but denies symptoms. Unable to successfully perform head impulse test due to neck stiffness.      Pertinent Vitals/Pain Pain Assessment Pain Assessment: Faces Faces Pain Scale: Hurts little more Pain Location: R knee Pain Descriptors / Indicators: Grimacing Pain Intervention(s): Monitored during session    Home Living  Prior Function            PT Goals (current goals can now be found in the care plan section) Acute Rehab PT Goals Patient Stated Goal: to resolve dizziness Progress towards PT goals: Progressing toward goals    Frequency    Min 3X/week      PT Plan Current plan remains appropriate    Co-evaluation              AM-PAC PT "6 Clicks" Mobility   Outcome Measure  Help needed turning from your back to your side while in a flat bed without using bedrails?: None Help needed moving from lying on your back to sitting on the side of a flat bed without using bedrails?: None Help needed  moving to and from a bed to a chair (including a wheelchair)?: None Help needed standing up from a chair using your arms (e.g., wheelchair or bedside chair)?: None Help needed to walk in hospital room?: A Little Help needed climbing 3-5 steps with a railing? : A Little 6 Click Score: 22    End of Session   Activity Tolerance: Patient tolerated treatment well Patient left: in chair;with call bell/phone within reach Nurse Communication: Mobility status PT Visit Diagnosis: Unsteadiness on feet (R26.81);Other abnormalities of gait and mobility (R26.89);Other symptoms and signs involving the nervous system (R29.898)     Time: 1287-8676 PT Time Calculation (min) (ACUTE ONLY): 27 min  Charges:  $Gait Training: 8-22 mins $Therapeutic Activity: 8-22 mins                     Arlyss Gandy, PT, DPT Acute Rehabilitation Pager: [phone] Office [phone]    Arlyss Gandy 11/12/2021, 11:43 AM

## 2021-11-12 NOTE — Evaluation (Addendum)
Occupational Therapy Evaluation Patient Details Name: Alejandra Schneider MRN: [id number] DOB: [birth year] Today's Date: 11/12/2021   History of Present Illness Alejandra Schneider is a 66 y.o. 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 Pt is at baseline Mod I - Ind level with ADLs/selfcare and sup - Mod I with mobility using no AD but states that he would like to cane or RW (reports that she sometimes furniture surfs at home). All education is completed and no further aute OT services are indicated at this time, OT will sign off     Recommendations for follow up therapy are one component of a multi-disciplinary discharge planning process, led by the attending physician.  Recommendations may be updated based on patient status, additional functional criteria and insurance authorization.   Follow Up Recommendations  No OT follow up    Assistance Recommended at Discharge PRN  Patient can return home with the following Assistance with cooking/housework;A little help with walking and/or transfers;Assist for transportation    Functional Status Assessment  Patient has not had a recent decline in their functional status  Equipment Recommendations  Tub/shower seat    Recommendations for Other Services       Precautions / Restrictions Precautions Precautions: Fall Restrictions Weight Bearing Restrictions: No      Mobility Bed Mobility Overal bed mobility: Independent                  Transfers Overall transfer level: Needs assistance Equipment used: None Transfers: Sit to/from Stand Sit to Stand: Supervision           General transfer comment: sup for safety, stooped posture      Balance Overall balance assessment: Mild deficits observed, not formally tested                                          ADL either performed or assessed with clinical judgement   ADL Overall ADL's : Modified independent;Independent;At baseline                                             Vision Baseline Vision/History: 1 Wears glasses Ability to See in Adequate Light: 0 Adequate Patient Visual Report: No change from baseline       Perception     Praxis      Pertinent Vitals/Pain Pain Assessment Pain Assessment: 0-10 Pain Score: 5  Pain Location: R knee Pain Descriptors / Indicators: Grimacing, Sore Pain Intervention(s): Monitored during session, Repositioned     Hand Dominance Right   Extremity/Trunk Assessment Upper Extremity Assessment Upper Extremity Assessment: Overall WFL for tasks assessed   Lower Extremity Assessment Lower Extremity Assessment: Defer to PT evaluation       Communication Communication Communication: No difficulties   Cognition Arousal/Alertness: Awake/alert Behavior During Therapy: WFL for tasks assessed/performed Overall Cognitive Status: Within Functional Limits for tasks assessed  General Comments       Exercises     Shoulder Instructions      Home Living Family/patient expects to be discharged to:: Private residence Living Arrangements: Other relatives (brother)   Type of Home: House Home Access: Level entry     Home Layout: One level     Bathroom Shower/Tub: Occupational psychologist: Standard     Home Equipment: None          Prior Functioning/Environment Prior Level of Function : Independent/Modified Independent;History of Falls (last six months);Other (comment)             Mobility Comments: Does not typically use an assistive device (would like one); Reports this episode and other falls are without the aura, and are unpredictable ADLs Comments: Ind with ADLs, IADLs, home mgt, does not drive         OT Problem List: Pain;Decreased activity tolerance      OT Treatment/Interventions:      OT Goals(Current goals can be found in the care plan section) Acute Rehab OT Goals Patient Stated Goal: go home  OT Frequency:      Co-evaluation              AM-PAC OT "6 Clicks" Daily Activity     Outcome Measure Help from another person eating meals?: None Help from another person taking care of personal grooming?: None Help from another person toileting, which includes using toliet, bedpan, or urinal?: None Help from another person bathing (including washing, rinsing, drying)?: None Help from another person to put on and taking off regular upper body clothing?: None Help from another person to put on and taking off regular lower body clothing?: None 6 Click Score: 24   End of Session    Activity Tolerance: Patient tolerated treatment well Patient left: in chair;with call bell/phone within reach  OT Visit Diagnosis: Other abnormalities of gait and mobility (R26.89);Pain Pain - Right/Left: Right Pain - part of body: Knee                Time: [id number] OT Time Calculation (min): 18 min Charges:  OT General Charges $OT Visit: 1 Visit OT Evaluation $OT Eval Moderate Complexity: 1 Mod    Britt Bottom 11/12/2021, 3:38 PM

## 2022-03-20 ENCOUNTER — Other Ambulatory Visit: Payer: Self-pay | Admitting: Internal Medicine

## 2023-06-01 IMAGING — CT CT HEAD W/O CM
4 series · 16 of 47 positions shown, 18 images · non-contrast
Comparison: CT head 12/12/2003

CLINICAL DATA: Syncope, dizziness history of seizure



[Series 3: head without · axial · non-contrast · 0.41mm/px · z∈[-43,+77]mm · 7 of 33 slices shown, 9 images]
[im 5/33  brain]
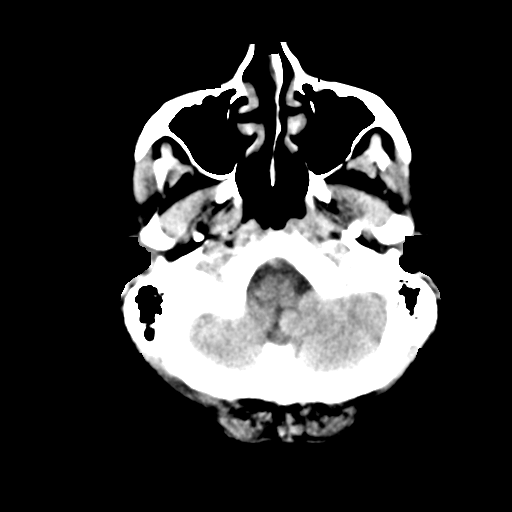
[im 5/33  bone]
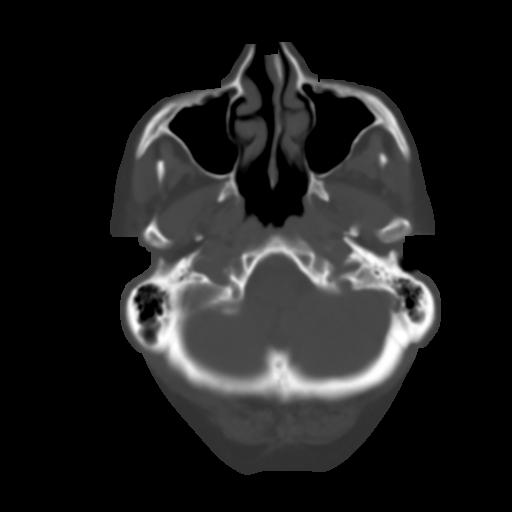
[im 9/33  brain]
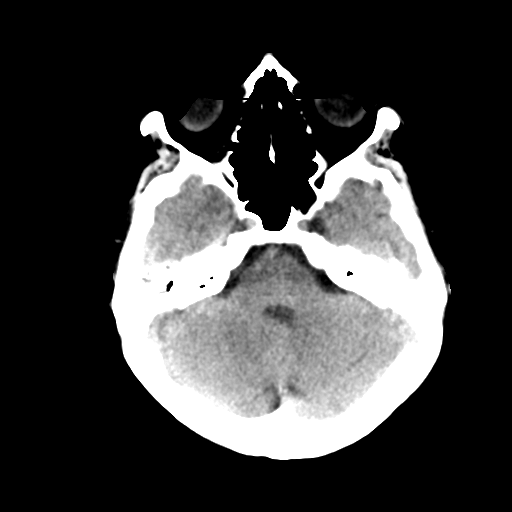
[im 13/33  brain]
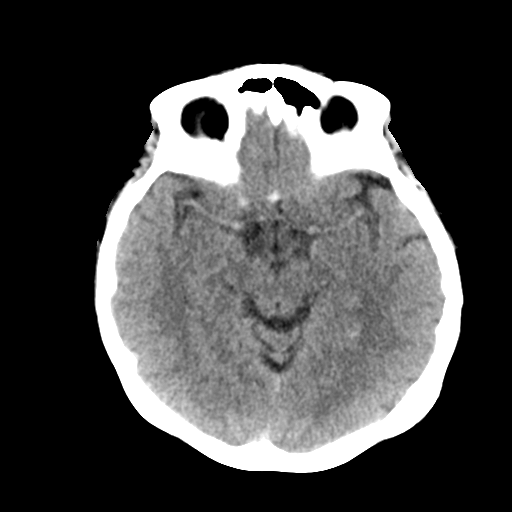
[im 17/33  brain]
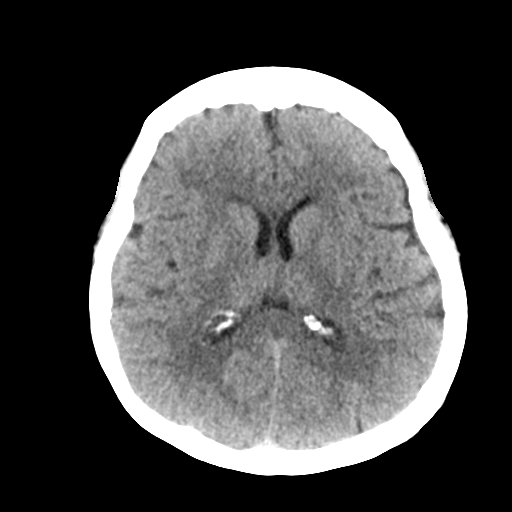
[im 21/33  brain]
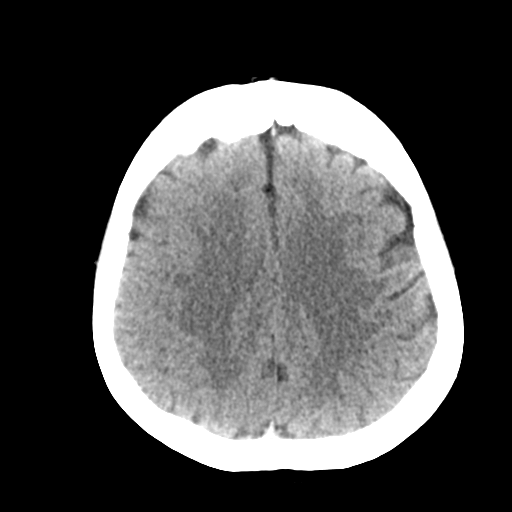
[im 21/33  bone]
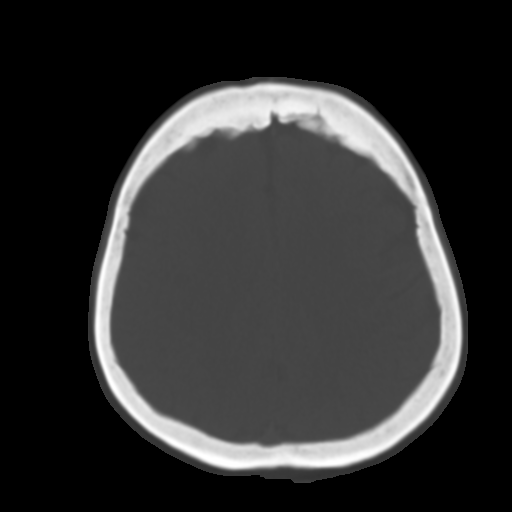
[im 25/33  brain]
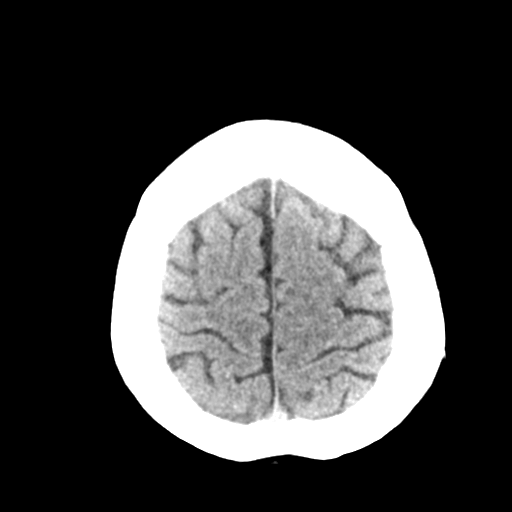
[im 29/33  brain]
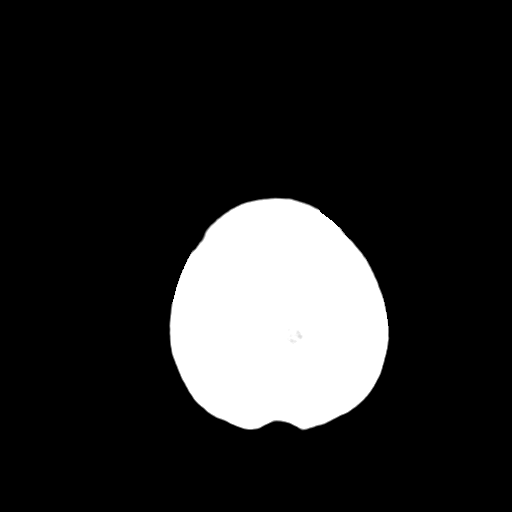

[Series 4: head bone · axial · 0.41mm/px · z∈[-47,-15]mm · 3 of 83 slices shown]
[im 9/83  bone]
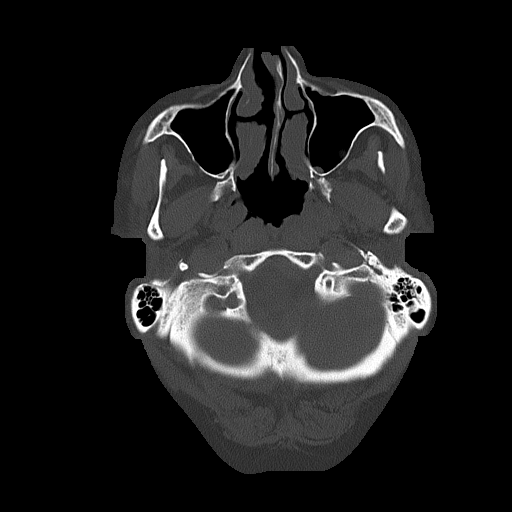
[im 17/83  bone]
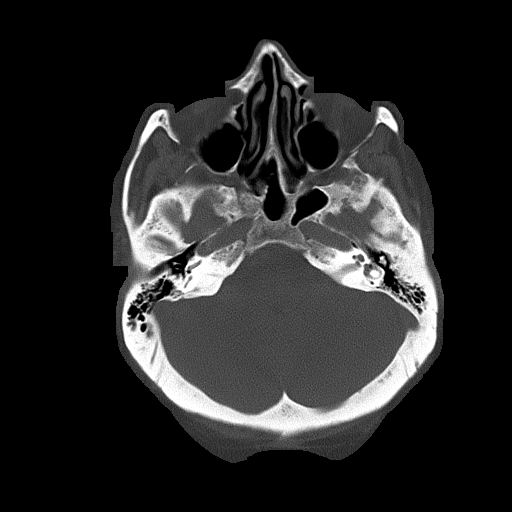
[im 25/83  bone]
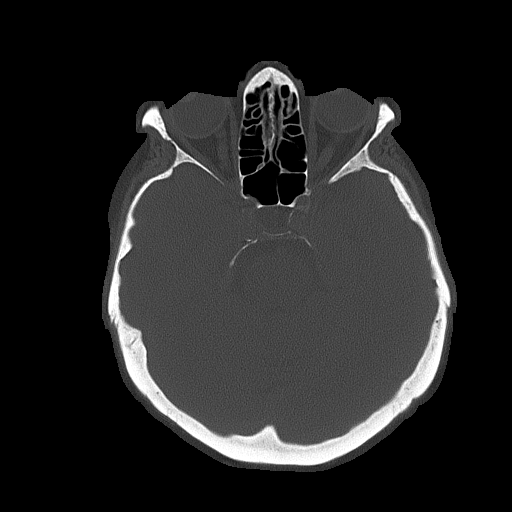

[Series 5: head without cor · coronal · non-contrast · 0.32mm/px · 3 of 60 slices shown]
[im 20/60  brain]
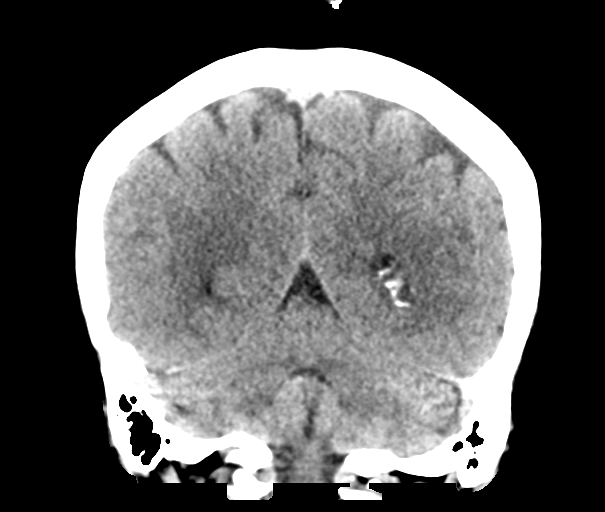
[im 27/60  brain]
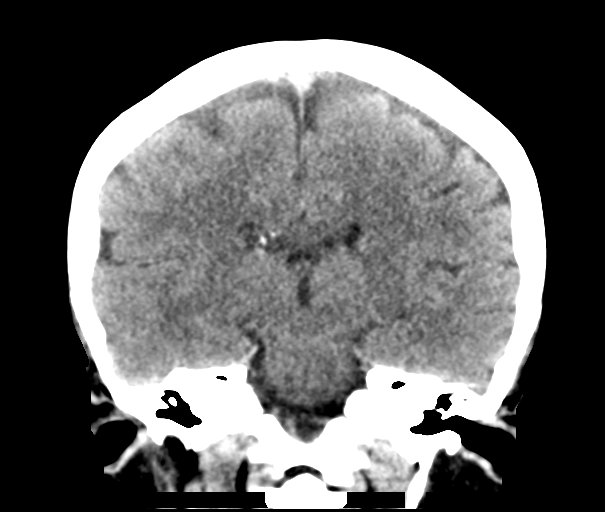
[im 33/60  brain]
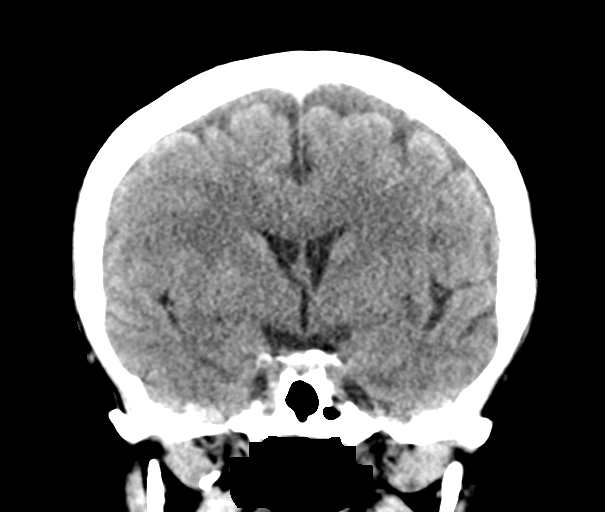

[Series 6: head without sag · sagittal · non-contrast · 0.32mm/px · 3 of 54 slices shown]
[im 18/54  brain]
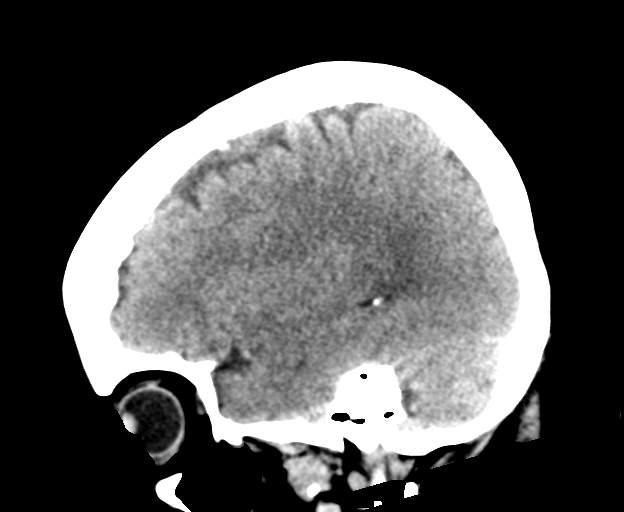
[im 27/54  brain]
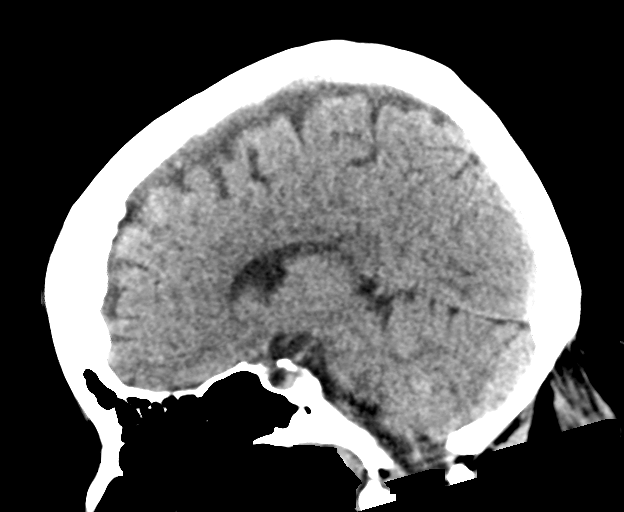
[im 36/54  brain]
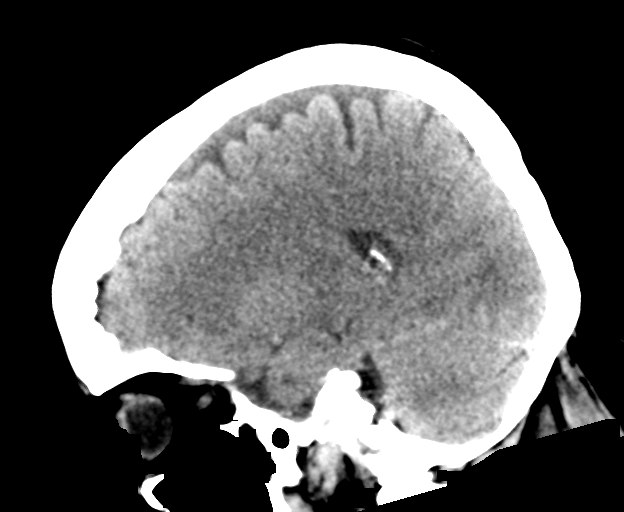

[16 of 47 positions shown; findings below may reference images not displayed]

FINDINGS: Brain: There is no evidence of acute intracranial hemorrhage,
extra-axial fluid collection, or acute infarct.

Parenchymal volume is normal. The ventricles are normal in size.
There is no significant burden of white matter microangiopathic
change. There is no mass lesion. There is no midline shift.

Vascular: There is mild calcification of the intracranial ICAs. No
dense vessel is seen.

Skull: Normal. Negative for fracture or focal lesion.

Sinuses/Orbits: The paranasal sinuses are clear. The globes and
orbits are unremarkable.

Other: None.
IMPRESSION: Unremarkable head CT with no acute intracranial pathology.

## 2023-06-01 IMAGING — CT CT CTA ABD/PEL W/CM AND/OR W/O CM
2 of 12 series · 11 of 46 positions shown, 15 images · IV contrast (APPLIED)
Comparison: 11/08/2008

CLINICAL DATA: 65-year-old female with history of syncopal episode,
abdominal pain.

EXAM:
CT ANGIOGRAPHY ABDOMEN AND PELVIS WITH CONTRAST AND WITHOUT CONTRAST
TECHNIQUE: Multidetector CT imaging of the abdomen and pelvis was performed
using the standard protocol during bolus administration of
intravenous contrast. Multiplanar reconstructed images and MIPs were
obtained and reviewed to evaluate the vascular anatomy.
CONTRAST:  100mL OMNIPAQUE IOHEXOL 350 MG/ML SOLN

[Series 9: cor · coronal · 0.76mm/px · 2 of 155 slices shown]
[im 52/155  soft-tissue]
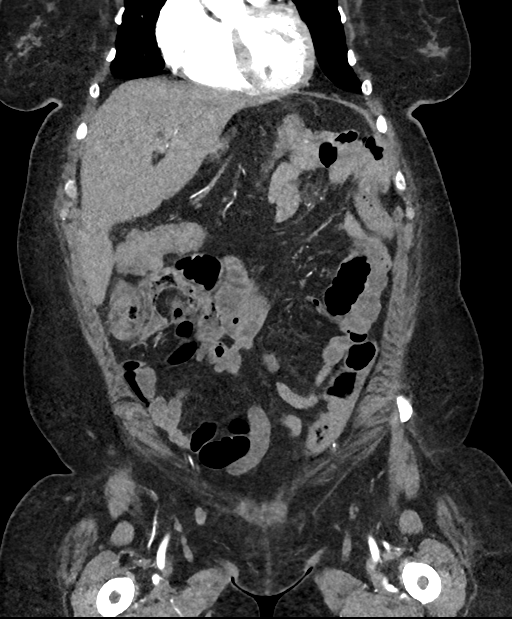
[im 103/155  soft-tissue]
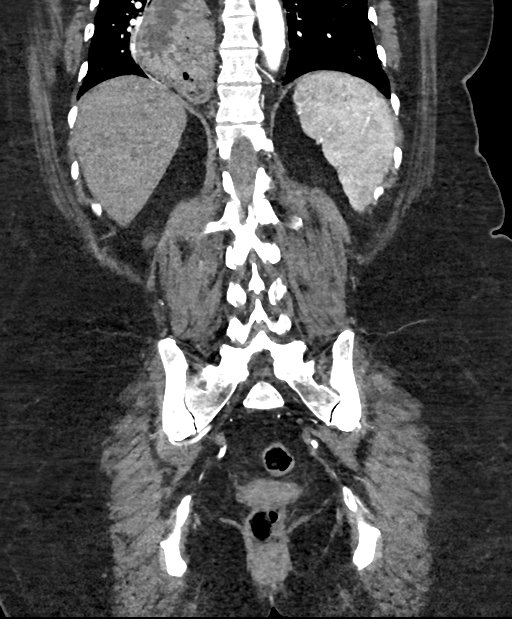

[Series 14: venous thins · axial · portal-venous · 0.86mm/px · z∈[+679,+1071]mm · 9 of 1175 slices shown, 13 images]
[im 98/1175  soft-tissue]
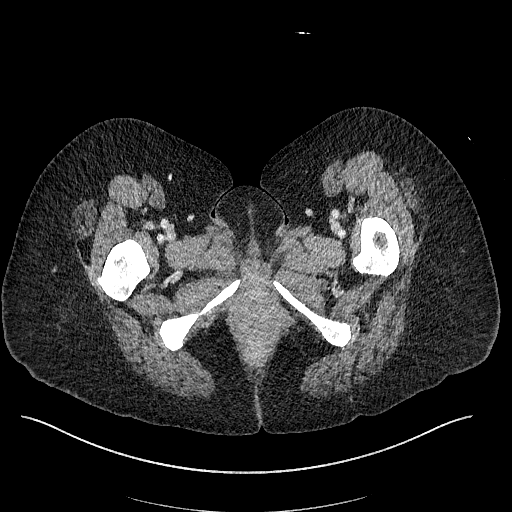
[im 98/1175  bone]
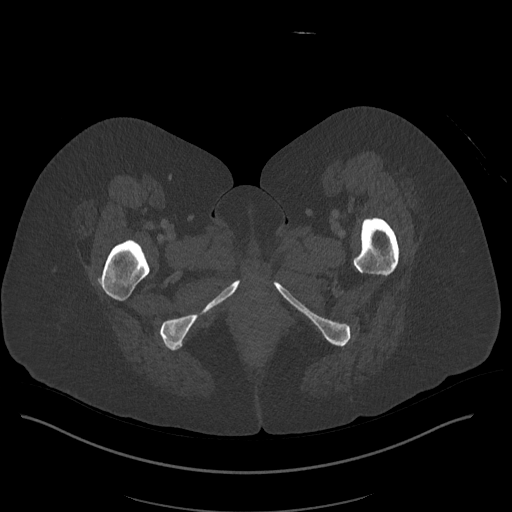
[im 294/1175  soft-tissue]
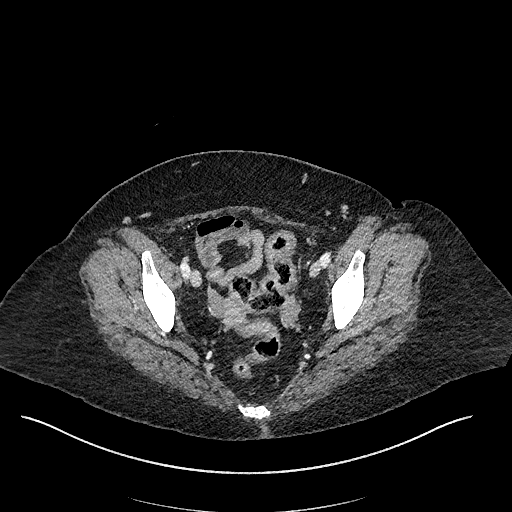
[im 392/1175  soft-tissue]
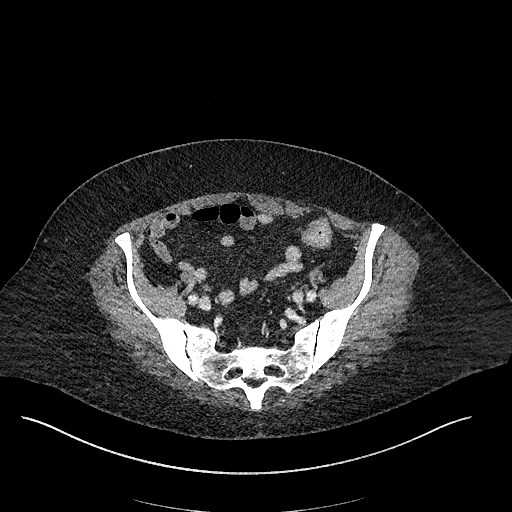
[im 490/1175  soft-tissue]
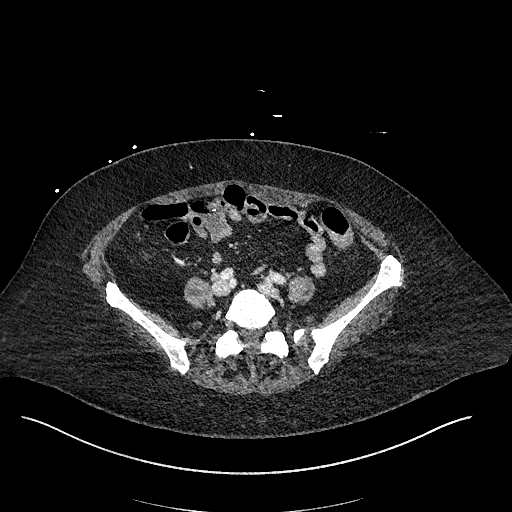
[im 685/1175  soft-tissue]
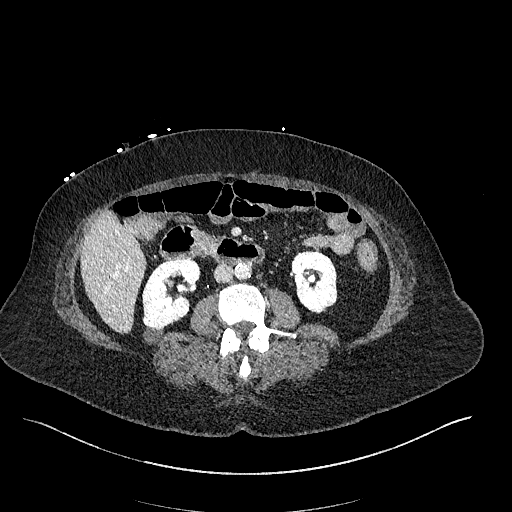
[im 783/1175  soft-tissue]
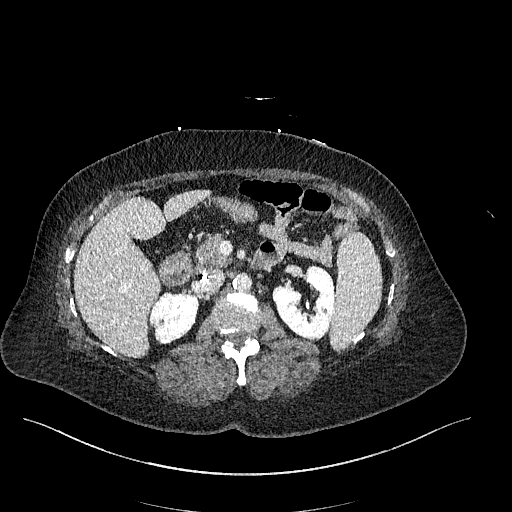
[im 783/1175  lung]
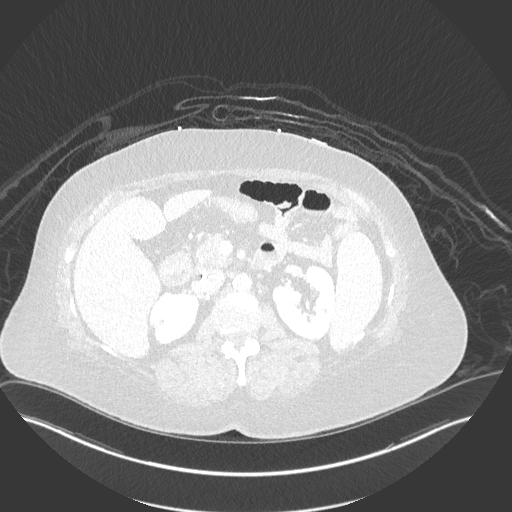
[im 881/1175  soft-tissue]
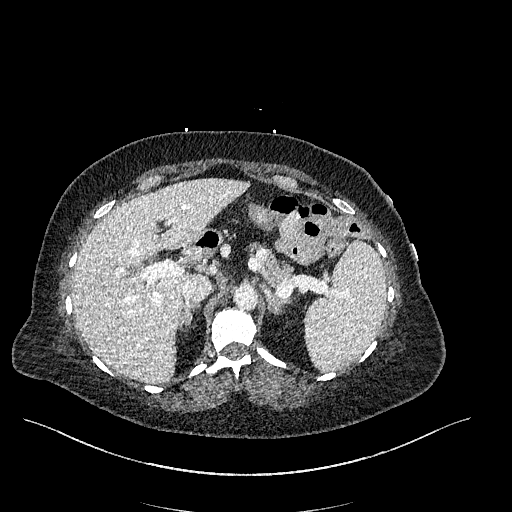
[im 881/1175  lung]
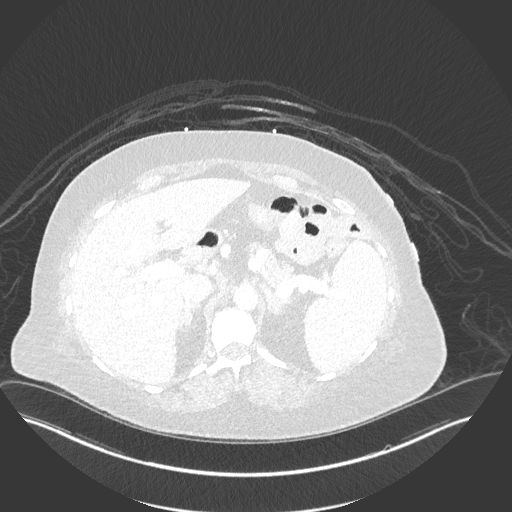
[im 979/1175  lung]
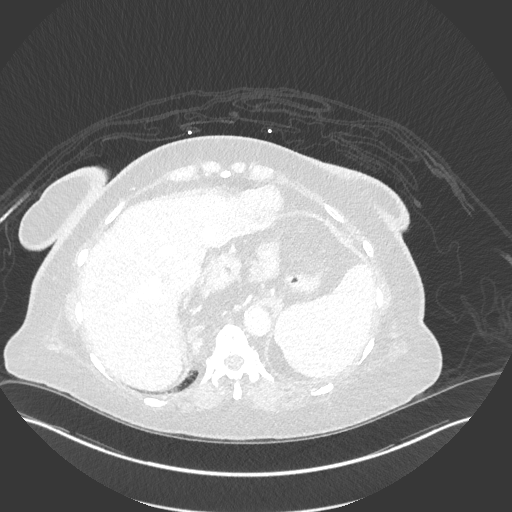
[im 1077/1175  soft-tissue]
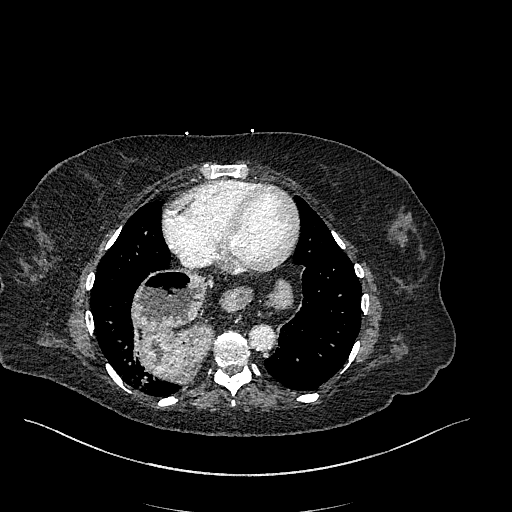
[im 1077/1175  lung]
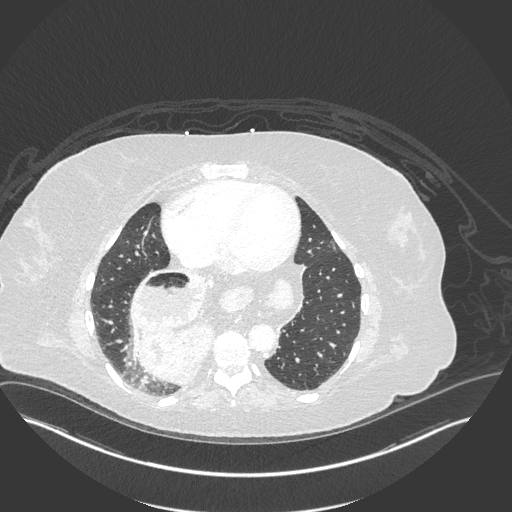

[11 of 46 positions shown; findings below may reference images not displayed]

FINDINGS: VASCULAR

Aorta: Atherosclerosis without signficant stenosis, dissection, or
aneurysm.

Celiac: Patent without evidence of aneurysm, dissection, vasculitis
or significant stenosis.

SMA: Patent without evidence of aneurysm, dissection, vasculitis or
significant stenosis.

Renals: Dual right and single left renal arteries are patent without
evidence of aneurysm, dissection, vasculitis, fibromuscular
dysplasia or significant stenosis.

IMA: Patent without evidence of aneurysm, dissection, vasculitis or
significant stenosis.

Inflow: Patent without evidence of aneurysm, dissection, vasculitis
or significant stenosis.

Proximal Outflow: Bilateral common femoral and visualized portions
of the superficial and profunda femoral arteries are patent without
evidence of aneurysm, dissection, vasculitis or significant
stenosis.

Veins: The hepatic veins are widely patent. The portal system is
widely patent and normal in caliber. The renal veins are patent
bilaterally in standard anatomic configuration. Infrarenal Fox
Dizon Nitinol inferior vena cava filter in place with extraluminal
position of multiple struts which extend into the abdominal aorta,
duodenum, and pericaval retroperitoneal fat. No evidence of
iliocaval thrombosis or anomaly.

Review of the MIP images confirms the above findings.

NON-VASCULAR

Lower chest: No acute abnormality.

Hepatobiliary: No focal liver abnormality is seen. Status post
cholecystectomy. No biliary dilatation.

Pancreas: Unremarkable. No pancreatic ductal dilatation or
surrounding inflammatory changes.

Spleen: Normal in size without focal abnormality.

Adrenals/Urinary Tract: Hypoattenuating left adrenal nodule
measuring up to 1.4 cm. The right adrenal gland is within normal
limits. Again seen are multifocal bilateral simple renal cysts, now
more numerous than 5575 comparison, the largest exophytic about the
right interpolar region measuring up to 1.8 cm. No hydronephrosis.
No nephrolithiasis.

Stomach/Bowel: Incompletely evaluated type 4 hiatal hernia which
includes a loop of transverse colon. The appendix is not
definitively identified. No evidence of bowel wall thickening or
dilation.

Lymphatic: No abdominopelvic lymphadenopathy.

Reproductive: Uterus and bilateral adnexa are unremarkable.

Other: No abdominal wall hernia or abnormality. No abdominopelvic
ascites.

Musculoskeletal: No acute osseous abnormality. Advanced degenerative
changes of the hips bilaterally, right greater than left.
IMPRESSION: VASCULAR

1. No acute vascular abnormality. Specifically, no evidence of acute
or chronic mesenteric ischemia.
2. Malpositioned chronic indwelling infrarenal IVC filter, unchanged
from [DATE] comparison.

NON-VASCULAR

1. No acute abdominopelvic abnormality.
2. Incompletely evaluated type 4 hiatal hernia. No evidence of
obstruction or strangulation. Chest CT could be obtained for further
characterization as clinically indicated.
3. Interval development of indeterminate but likely benign left
adrenal nodule measuring up to 1.4 cm. Twelve month follow-up CT or
MRI adrenal mass protocol could be considered for surveillance.
(Nez, Riki JH, Hutamo, Binono IR, Moreira GM, Zeinab
Richarbs, Nashana PV. Management of Incidental Adrenal
Masses: A White Paper of the ACR Incidental Findings Committee. [HOSPITAL]. [DATE]):6506-6555. doi:
10.1016/j.jacr.[id number]. Epub [DATE]. PMID: [id number].)

## 2023-10-05 ENCOUNTER — Other Ambulatory Visit: Payer: Self-pay

## 2024-06-25 NOTE — Nursing Note (Signed)
 Went over AVS with pt. Pt verbalized understanding. Pt denies any needs or pain at this time. Pt showing no signs of distress. Pt has hard script for prednisone, O2 from [employer], and all personal belongings. Pt will be transported home via personal vehicle by family. Pt is ambulates independently without shortness of breath at this time.

## 2024-06-26 NOTE — Discharge Summary (Signed)
 [name]  [facility] DISCHARGE SUMMARY  PCP: No primary care provider on file. Discharge Details   Admit date:         06/24/2024 Discharge date and time:       06/25/2024 Hospital LOS:    1 days Discharge to:    home  Condition at Discharge:   good  Code Status: Full Code Diagnoses:    Active Hospital Problems   Diagnosis Date Noted POA  . *Acute hypoxemic respiratory failure (*) 12/11/2015 Yes  . 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 Pancytopenia (*) 12/09/2015 Yes  . COPD with acute exacerbation (*) 12/09/2015 Yes  . Hypoxia 06/25/2024 Yes  . Acute respiratory failure with hypoxia (*) 06/24/2024 Yes  . COPD (chronic obstructive pulmonary disease) (*) 12/03/2019 Yes  . Essential hypertension 10/21/2018 Yes  . Tobacco use 12/09/2015 Yes  . Recurrent seizures (*) 10/26/2013 Yes    Resolved Hospital Problems  No resolved problems to display.    Discharge medications    Current Discharge Medication List     START taking these medications      Details  budesonide-formoterol 160-4.5 mcg/actuation inhaler Commonly known as: SYMBICORT  Inhale two puffs into the lungs 2 (two) times daily. Quantity: 6 g   ibuprofen 600 mg tablet Commonly known as: ADVIL,MOTRIN  Take one tablet (600 mg dose) by mouth every 6 (six) hours as needed for Pain. Quantity: 120 tablet   predniSONE 10 mg tablet Commonly known as: DELTASONE  2 tabs bid for 2 days followed by  2 tabs in AM and 1 tab in PM for two days followed by 1 tab bid for two days followed by 1 tab daily for two days Indication: Vasculitis Quantity: 20 tablet       CONTINUE these medications which have NOT CHANGED      Details  albuterol  sulfate HFA 108 (90 Base) MCG/ACT inhaler Commonly known as: PROVENTIL ,VENTOLIN ,PROAIR   Inhale one puff to two puffs into the lungs every 6 (six) hours as needed. Quantity: 18 g   clonazePAM  0.5 mg  tablet Commonly known as: KLONOPIN   Take one tablet (0.5 mg dose) by mouth at bedtime as needed for Anxiety. Quantity: 30 tablet   cloNIDine  0.1 mg tablet Commonly known as: CATAPRES   Take one tablet (0.1 mg dose) by mouth 2 (two) times daily. Quantity: 60 tablet   levETIRAcetam  500 mg 24 hr tablet Commonly known as: KEPPRA  XR  Take one tablet (500 mg dose) by mouth daily. Quantity: 30 tablet   methadone  10 mg/5 mL solution Commonly known as: DOLOPHINE   Take 41 mLs (205 mg dose) by mouth daily.   naloxone nasal spray (TAKE HOME PACK) Commonly known as: NARCAN  one spray by Nasal route once as needed for up to 1 dose. Quantity: 1 each      * You might also be taking other medications not listed above. If you have questions about any of your other medications, talk to the person who prescribed them or your Primary Care Provider.          Diet     Code Status:    Full Code Bedside Procedures   No orders found    NH Medically Stable for Discharge: Ready   Exam at Discharge  BP 142/75 (BP Location: Left Upper Arm, Patient Position: Lying)   Pulse 74   Temp 97.3 F (36.3 C) (Oral)   Resp 19   Ht 1.524 m (5')  Wt 73.8 kg (162 lb 12.8 oz)   LMP  (LMP Unknown)   SpO2 91%   BMI 31.79 kg/m   General: NAD, alert Cardiovascular: Regular rate and rhythm, no murmurs or gallops Pulmonary: Lungs clear to auscultation, no wheezes or rales Abdominal: Bowel sounds positive, soft, non-distended, non-tender to palpation Genitourinary: No bladder or CVA tenderness Skin: Intact, turgor normal Extremities: Not contracted, no peripheral edema Neuro: No gross neurological defects Psych: Mood and affect normal range   Immunization  assessed the patient's Influenza and pneumococcal vaccine status and administerer per hospital protocol as needed.  Prevention Hospital protocols were followed to prevent fall, aspiration, delirium, decubitus ulcer and UTI.   Hospital Course    Indication for Admission: Alejandra Schneider is a 68 y.o. female who  has a past medical history of Anxiety, Clotting disorder (*), COPD (chronic obstructive pulmonary disease) (*), DVT (deep vein thrombosis) in pregnancy (*), DVT (deep venous thrombosis) (*), Head ache, Hepatitis C, Opiate addiction (*), Pulmonary embolism (*), and Seizures (*)., and  has a past surgical history that includes Cholecystectomy; Appendectomy; and greenfield filter. She is placed on telemetry observation mainly to arrange oxygen for her acute on chronic respiratory failure with hypoxia secondary to mild COPD exacerbation after she presented with bilateral leg swelling and maculopapular lesions of the lower legs and was found to have wheezing and desaturated on oxygen below 88%.  Chest x-ray was normal. History of Present Illness:  Patient states that she used to have oxygen but that tank has been empty for a while and she does not use it.  She endorses shortness of breath with exertion and occasional wheezing.  She has been wheezing for the couple of days but had ran out of her inhalers and was not doing anything about it.  She denies any fever.  No chest pain palpitation or diaphoresis.  No cough or hemoptysis.  Denies orthopnea or paroxysmal nocturnal dyspnea.  She continues to smoke.  She does not follow-up with primary care provider and pulmonology.  Hospital Course:       The patient was monitored for compliance with her oxygen intermittently.  She remained asymptomatic.  She was started on prednisone after a dose of Solu-Medrol in the ED.  Her lungs cleared and her lower extremity vasculitis like lesions also slightly improved.  She was discharged on a tapering dose of prednisone to follow-up with primary care provider and dermatology in the coming 2 weeks.  The patient will also follow-up with hematology for her pancytopenia as previously scheduled.  Patient has been noncompliant with follow-ups and will hopefully  follow-up at this time.   Labs on Discharge:  Recent Labs    Units 06/24/24 1341  WBC thou/mcL 2.1*  HGB gm/dL 9.7*  HCT % 66.4*  PLT thou/mcL 117*    Recent Labs    Units 06/24/24 1341  NA mmol/L 142  K mmol/L 4.0  CL mmol/L 103  CO2 mmol/L 31  BUN mg/dL 15  CREATININE mg/dL 9.22  CALCIUM mg/dL 8.9   No results for input(s): TSH, HBA1C in the last 168 hours. Recent Labs    Units 06/24/24 1341  BILITOT mg/dL <9.7  AST U/L 32  ALT U/L 21  ALKPHOS U/L 136  ALBUMIN gm/dL 3.5*   No results for input(s): LABPROT, INR, PTT in the last 168 hours.  No results for input(s): CHOL, LDL, HDL, TRIG in the last 168 hours.  Diagnostics:    [facility]   Other  Instructions     Ambulatory referral to Pulmonology     Reason for referral: COPD   Referral Type: Consultation   Evaluate and Return   COPD patient instructions     Do the following to manage your overall health at home: 1. Follow up with your primary care provider within 7 days. 2. Avoid smoking or being around smoke. 3. Wash your hands often, especially after contact with other people and before eating. 4. Avoid being around people who are sick if possible, and get plenty of sleep. 5. Take all of your medications as prescribed.  If you feel the need to discuss medication changes with any of your outpatient providers first, please do so as soon as possible.  If you experience any side effects, contact your healthcare provider right away.  Do the following to manage your COPD at home: 6. Get the flu vaccine yearly to protect against the flu. 7. There are 2 kinds of pneumococcal (pneumonia) vaccines: Prevnar 13 and Pneumovax 23. Talk to your healthcare provider about the benefits of getting the pneumonia vaccine and what is recommended for you.  8. Good inhaler medication technique is essential to be effective. If you have questions, ask your healthcare provider, pharmacist, or a respiratory  therapist to review how to use an inhaler properly.   9. Pulmonary rehab may make you feel better and improve activity tolerance. Discuss this with your healthcare provider at follow up.  10. Your lung capacity should be measured with pulmonary function tests (PFTs) if not already done.  Ask your primary care provider or pulmonologist for more details if needed. 11. Call your healthcare provider/seek medical attention if you experience any new or worsening: - Shortness of breath - Cough or sputum production - Fevers >101 - Chest pain   Follow-up with Primary Care Physician     Reason for referral: COPD exacerbation   Patient aware of referral?: Yes   Referral Type: Consultation   Evaluate and Return   Notify Physician for change in color/consistency of sputum     Notify Physician for palpitations (Fluttering in your chest)     Notify Physician for trouble breathing or symptoms that are worse     Notify physician - Temp     Call MD if Temperature above 100 Degrees F      Appointments which have been scheduled    Jun 30, 2024 11:00 AM New Patient with Bethann Nettles, MD [facility] (--) [address] [place] [address] [phone]   Jul 14, 2024 9:00 AM New Patient with Delon JONELLE Loader, PA [facility] ([facility] [address] [place] [address] [phone]   Jul 14, 2024 10:00 AM Lab with [facility] ONC LAB [facility] ([facility] [address] [place] [address] [phone]   Jul 17, 2024 2:20 PM New Patient with Rosi Mowers, PA-C [facility] (--) [address] [place] [address] [phone]   Aug 01, 2024 8:30 AM Office Visit with Rosina Derril Pinal, FNP [facility] [place] (--) [address] [place] [address] [phone]      Discharge Procedure Orders  Follow-up with Primary Care Physician  Referral Priority: Routine Referral Type: Consultation  Referral Reason: Evaluate and Return  Number of Visits Requested: 1 Expiration Date: 12/21/24   Ambulatory referral to Pulmonology  Referral Priority:  Routine Referral Type: Consultation  Referral Reason: Evaluate and Return  Requested Specialty: Pulmonary Diseases  Number of Visits Requested: 1 Expiration Date: 12/21/24   Cardiac diet (heart healthy)   Notify physician - Temp  Order Comments: Call MD if Temperature above 100 Degrees F   Notify Physician for trouble breathing or symptoms that are worse   Notify Physician for change in color/consistency of sputum   Notify Physician for palpitations (Fluttering in your chest)   Activity as tolerated   COPD patient instructions  Order Comments: Do the following to manage your overall health at home: 1. Follow up with your primary care provider within 7 days. 2. Avoid smoking or being around smoke. 3. Wash your hands often, especially after contact with other people and before eating. 4. Avoid being around people who are sick if possible, and get plenty of sleep. 5. Take all of your medications as prescribed.  If you feel the need to discuss medication changes with any of your outpatient providers first, please do so as soon as possible.  If you experience any side effects, contact your healthcare provider right away.  Do the following to manage your COPD at home: 6. Get the flu vaccine yearly to protect against the flu. 7. There are 2 kinds of pneumococcal (pneumonia) vaccines: Prevnar 13 and Pneumovax 23. Talk to your healthcare provider about the benefits of getting the pneumonia vaccine and what is recommended for you.  8. Good inhaler medication technique is essential to be effective. If you have questions, ask your healthcare provider, pharmacist,  or a respiratory therapist to review how to use an inhaler properly.   9. Pulmonary rehab may make you feel better and improve activity tolerance. Discuss this with your healthcare provider at follow up.  10. Your lung capacity should be measured with pulmonary function tests (PFTs) if not already done.  Ask your primary care provider or pulmonologist for more details if needed. 11. Call your healthcare provider/seek medical attention if you experience any new or worsening: - Shortness of breath - Cough or sputum production - Fevers >101 - Chest pain    Recommendations to physicians: none Potential for Rehab:        Good   Followup appointments:  Future Appointments  Date Time Provider Department Center  06/30/2024 11:00 AM Bethann Nettles, MD [facility] None  07/14/2024  9:00 AM Delon JONELLE Loader, PA [facility]  07/14/2024 10:00 AM [facility] ONC LAB [facility]  07/17/2024  2:20 PM Riti Howard, PA-C [facility] None  08/01/2024  8:30 AM Rosina Derril Pinal, FNP [facility] None      Documentation for time-based billing:  Total spent in discharge process and inpatient care,hospital follow-up care care today was 25 minutes.  Patient care activities included preparing to see the patient such as reviewing the patient record, obtaining and/or reviewing separately obtained history, performing a medically appropriate history and physical examination, counseling and educating the patient, family, and/or caregiver, ordering prescription medications, tests, or procedures, referring and communicating with other health care providers when not separately reported during the visit, documenting clinical information in the electronic or other health record, independently interpreting results when not separately reported, communicating results to the patient/family/caregiver, and coordinating the care of the patient when not separately reported.    This note was dictated with voice recognition software. Similar  sounding words can inadvertently get transcribed and not get corrected upon review.  For any questions please contact Dr. Kaleab. [phone] Cell:    (  [phone]  Electronically signed: Aline KANDICE Lek, MD 06/26/2024 / 8:47 AM *Some images could not be shown.

## 2024-06-27 ENCOUNTER — Encounter (HOSPITAL_COMMUNITY): Payer: Self-pay

## 2024-06-27 ENCOUNTER — Emergency Department (HOSPITAL_COMMUNITY)

## 2024-06-27 ENCOUNTER — Observation Stay (HOSPITAL_COMMUNITY)
Admission: EM | Admit: 2024-06-27 | Discharge: 2024-06-30 | Disposition: A | Discharge status: Home / Self Care | Source: Ambulatory Visit | Attending: Internal Medicine | Admitting: Internal Medicine

## 2024-06-27 ENCOUNTER — Other Ambulatory Visit: Payer: Self-pay

## 2024-06-27 DIAGNOSIS — G4089 Other seizures: Secondary | ICD-10-CM | POA: Diagnosis not present

## 2024-06-27 DIAGNOSIS — K529 Noninfective gastroenteritis and colitis, unspecified: Secondary | ICD-10-CM | POA: Diagnosis not present

## 2024-06-27 DIAGNOSIS — E876 Hypokalemia: Secondary | ICD-10-CM | POA: Insufficient documentation

## 2024-06-27 DIAGNOSIS — J449 Chronic obstructive pulmonary disease, unspecified: Secondary | ICD-10-CM | POA: Diagnosis not present

## 2024-06-27 DIAGNOSIS — I4581 Long QT syndrome: Secondary | ICD-10-CM | POA: Diagnosis not present

## 2024-06-27 DIAGNOSIS — R112 Nausea with vomiting, unspecified: Principal | ICD-10-CM

## 2024-06-27 DIAGNOSIS — I700 Atherosclerosis of aorta: Secondary | ICD-10-CM | POA: Diagnosis not present

## 2024-06-27 DIAGNOSIS — R1110 Vomiting, unspecified: Secondary | ICD-10-CM | POA: Diagnosis present

## 2024-06-27 DIAGNOSIS — R161 Splenomegaly, not elsewhere classified: Secondary | ICD-10-CM | POA: Insufficient documentation

## 2024-06-27 DIAGNOSIS — I10 Essential (primary) hypertension: Secondary | ICD-10-CM | POA: Insufficient documentation

## 2024-06-27 DIAGNOSIS — F17210 Nicotine dependence, cigarettes, uncomplicated: Secondary | ICD-10-CM | POA: Insufficient documentation

## 2024-06-27 DIAGNOSIS — K449 Diaphragmatic hernia without obstruction or gangrene: Secondary | ICD-10-CM | POA: Insufficient documentation

## 2024-06-27 LAB — LIPASE, BLOOD: Lipase: 22 U/L (ref 11–51)

## 2024-06-27 LAB — URINALYSIS, ROUTINE W REFLEX MICROSCOPIC
Bacteria, UA: NONE SEEN
Bilirubin Urine: NEGATIVE
Glucose, UA: 150 mg/dL — AB
Hgb urine dipstick: NEGATIVE
Ketones, ur: 5 mg/dL — AB
Nitrite: NEGATIVE
Protein, ur: NEGATIVE mg/dL
Specific Gravity, Urine: 1.018 (ref 1.005–1.030)
pH: 6 (ref 5.0–8.0)

## 2024-06-27 LAB — COMPREHENSIVE METABOLIC PANEL WITH GFR
ALT: 36 U/L (ref 0–44)
AST: 54 U/L — ABNORMAL HIGH (ref 15–41)
Albumin: 4.3 g/dL (ref 3.5–5.0)
Alkaline Phosphatase: 164 U/L — ABNORMAL HIGH (ref 38–126)
Anion gap: 15 (ref 5–15)
BUN: 10 mg/dL (ref 8–23)
CO2: 26 mmol/L (ref 22–32)
Calcium: 9.7 mg/dL (ref 8.9–10.3)
Chloride: 96 mmol/L — ABNORMAL LOW (ref 98–111)
Creatinine, Ser: 0.64 mg/dL (ref 0.44–1.00)
GFR, Estimated: >60 mL/min (ref >60)
Glucose, Bld: 264 mg/dL — ABNORMAL HIGH (ref 70–99)
Potassium: 3.3 mmol/L — ABNORMAL LOW (ref 3.5–5.1)
Sodium: 137 mmol/L (ref 135–145)
Total Bilirubin: 0.5 mg/dL (ref 0.0–1.2)
Total Protein: 8.3 g/dL — ABNORMAL HIGH (ref 6.5–8.1)

## 2024-06-27 LAB — CBC WITH DIFFERENTIAL/PLATELET
Abs Immature Granulocytes: 0.03 K/uL (ref 0.00–0.07)
Basophils Absolute: 0 K/uL (ref 0.0–0.1)
Basophils Relative: 1 %
Eosinophils Absolute: 0 K/uL (ref 0.0–0.5)
Eosinophils Relative: 0 %
HCT: 41.8 % (ref 36.0–46.0)
Hemoglobin: 12.5 g/dL (ref 12.0–15.0)
Immature Granulocytes: 1 %
Lymphocytes Relative: 11 %
Lymphs Abs: 0.4 K/uL — ABNORMAL LOW (ref 0.7–4.0)
MCH: 23.4 pg — ABNORMAL LOW (ref 26.0–34.0)
MCHC: 29.9 g/dL — ABNORMAL LOW (ref 30.0–36.0)
MCV: 78.1 fL — ABNORMAL LOW (ref 80.0–100.0)
Monocytes Absolute: 0.2 K/uL (ref 0.1–1.0)
Monocytes Relative: 6 %
Neutro Abs: 3.3 K/uL (ref 1.7–7.7)
Neutrophils Relative %: 81 %
Platelets: 163 K/uL (ref 150–400)
RBC: 5.35 MIL/uL — ABNORMAL HIGH (ref 3.87–5.11)
RDW: 17.5 % — ABNORMAL HIGH (ref 11.5–15.5)
WBC: 4 K/uL (ref 4.0–10.5)
nRBC: 0 % (ref 0.0–0.2)

## 2024-06-27 LAB — MAGNESIUM
Magnesium: 1.9 mg/dL (ref 1.7–2.4)
Magnesium: 1.8 mg/dL (ref 1.7–2.4)

## 2024-06-27 MED ORDER — LEVETIRACETAM (KEPPRA) 500 MG/5 ML ADULT IV PUSH: 500.0000 mg | Freq: Two times a day (BID) | INTRAVENOUS | Status: DC

## 2024-06-27 MED ORDER — SODIUM CHLORIDE 0.9 % IV BOLUS: 1000.0000 mL | Freq: Once | INTRAVENOUS | Status: DC

## 2024-06-27 MED ORDER — HYDRALAZINE HCL 20 MG/ML IJ SOLN
5.0000 mg | Freq: Once | INTRAMUSCULAR | Status: AC
  Administered 2024-06-27: 5 mg via INTRAVENOUS
  Filled 2024-06-27: qty 1

## 2024-06-27 MED ORDER — CLONIDINE HCL 0.1 MG PO TABS
0.1000 mg | ORAL_TABLET | Freq: Two times a day (BID) | ORAL | Status: DC
  Administered 2024-06-27 – 2024-06-29 (×4): 0.1 mg via ORAL
  Filled 2024-06-27 (×5): qty 1

## 2024-06-27 MED ORDER — LORAZEPAM 2 MG/ML IJ SOLN: 1.0000 mg | Freq: Four times a day (QID) | INTRAMUSCULAR | Status: DC | PRN

## 2024-06-27 MED ORDER — ALBUTEROL SULFATE (2.5 MG/3ML) 0.083% IN NEBU: 2.5000 mg | INHALATION_SOLUTION | RESPIRATORY_TRACT | Status: DC | PRN

## 2024-06-27 MED ORDER — IOHEXOL 300 MG/ML  SOLN
100.0000 mL | Freq: Once | INTRAMUSCULAR | Status: AC | PRN
  Administered 2024-06-27: 100 mL via INTRAVENOUS

## 2024-06-27 MED ORDER — PANTOPRAZOLE SODIUM 40 MG IV SOLR
40.0000 mg | Freq: Every day | INTRAVENOUS | Status: DC
  Administered 2024-06-27: 40 mg via INTRAVENOUS
  Filled 2024-06-27: qty 10

## 2024-06-27 MED ORDER — MIDAZOLAM HCL 2 MG/2ML IJ SOLN
2.0000 mg | Freq: Once | INTRAMUSCULAR | Status: AC
  Administered 2024-06-27: 2 mg via INTRAVENOUS
  Filled 2024-06-27: qty 2

## 2024-06-27 MED ORDER — LEVETIRACETAM (KEPPRA) 500 MG/5 ML ADULT IV PUSH
250.0000 mg | Freq: Two times a day (BID) | INTRAVENOUS | Status: DC
  Administered 2024-06-27 – 2024-06-28 (×2): 250 mg via INTRAVENOUS
  Filled 2024-06-27 (×2): qty 2.5

## 2024-06-27 MED ORDER — KETOROLAC TROMETHAMINE 15 MG/ML IJ SOLN
15.0000 mg | Freq: Once | INTRAMUSCULAR | Status: AC
  Administered 2024-06-27: 15 mg via INTRAVENOUS
  Filled 2024-06-27: qty 1

## 2024-06-27 MED ORDER — SODIUM CHLORIDE 0.9 % IV SOLN: INTRAVENOUS | Status: AC

## 2024-06-27 MED ORDER — ONDANSETRON HCL 4 MG PO TABS: 4.0000 mg | ORAL_TABLET | Freq: Four times a day (QID) | ORAL | Status: DC | PRN

## 2024-06-27 MED ORDER — HYDROMORPHONE HCL 1 MG/ML IJ SOLN
0.5000 mg | INTRAMUSCULAR | Status: DC | PRN
  Administered 2024-06-27 – 2024-06-28 (×3): 0.5 mg via INTRAVENOUS
  Filled 2024-06-27 (×3): qty 0.5

## 2024-06-27 MED ORDER — ACETAMINOPHEN 650 MG RE SUPP: 650.0000 mg | Freq: Four times a day (QID) | RECTAL | Status: DC | PRN

## 2024-06-27 MED ORDER — TRAZODONE HCL 50 MG PO TABS
25.0000 mg | ORAL_TABLET | Freq: Every evening | ORAL | Status: DC | PRN
  Administered 2024-06-29: 25 mg via ORAL
  Filled 2024-06-27: qty 1

## 2024-06-27 MED ORDER — ACETAMINOPHEN 325 MG PO TABS
650.0000 mg | ORAL_TABLET | Freq: Four times a day (QID) | ORAL | Status: DC | PRN
  Administered 2024-06-28 – 2024-06-29 (×2): 650 mg via ORAL
  Filled 2024-06-27 (×2): qty 2

## 2024-06-27 MED ORDER — ONDANSETRON HCL 4 MG/2ML IJ SOLN: 4.0000 mg | Freq: Four times a day (QID) | INTRAMUSCULAR | Status: DC | PRN

## 2024-06-27 MED ORDER — POTASSIUM CHLORIDE 10 MEQ/100ML IV SOLN
10.0000 meq | INTRAVENOUS | Status: AC
  Administered 2024-06-27 (×2): 10 meq via INTRAVENOUS
  Filled 2024-06-27 (×2): qty 100

## 2024-06-27 MED ORDER — SODIUM CHLORIDE 0.9 % IV BOLUS
1000.0000 mL | Freq: Once | INTRAVENOUS | Status: AC
  Administered 2024-06-27: 1000 mL via INTRAVENOUS

## 2024-06-27 MED ORDER — HYDRALAZINE HCL 20 MG/ML IJ SOLN
5.0000 mg | Freq: Four times a day (QID) | INTRAMUSCULAR | Status: DC | PRN
  Administered 2024-06-29: 5 mg via INTRAVENOUS
  Filled 2024-06-27: qty 1

## 2024-06-27 MED ORDER — ONDANSETRON HCL 4 MG/2ML IJ SOLN
4.0000 mg | Freq: Once | INTRAMUSCULAR | Status: AC
  Administered 2024-06-27: 4 mg via INTRAVENOUS
  Filled 2024-06-27: qty 2

## 2024-06-27 MED ORDER — ENOXAPARIN SODIUM 40 MG/0.4ML IJ SOSY
40.0000 mg | PREFILLED_SYRINGE | INTRAMUSCULAR | Status: DC
  Administered 2024-06-27: 40 mg via SUBCUTANEOUS
  Filled 2024-06-27 (×2): qty 0.4

## 2024-06-27 MED ORDER — FAMOTIDINE IN NACL 20-0.9 MG/50ML-% IV SOLN
20.0000 mg | Freq: Once | INTRAVENOUS | Status: AC
  Administered 2024-06-27: 20 mg via INTRAVENOUS
  Filled 2024-06-27: qty 50

## 2024-06-27 NOTE — ED Triage Notes (Signed)
 Pt BIB EMS from the methadone  clinic with reports of nausea and vomiting. Pt missed several doses and was there to take a dose today.

## 2024-06-27 NOTE — Plan of Care (Signed)
  Problem: Education: Goal: Knowledge of General Education information will improve Description: Including pain rating scale, medication(s)/side effects and non-pharmacologic comfort measures Outcome: Progressing   Problem: Health Behavior/Discharge Planning: Goal: Ability to manage health-related needs will improve Outcome: Progressing   Problem: Nutrition: Goal: Adequate nutrition will be maintained Outcome: Progressing   Problem: Coping: Goal: Level of anxiety will decrease Outcome: Progressing   Problem: Pain Managment: Goal: General experience of comfort will improve and/or be controlled Outcome: Progressing   Problem: Skin Integrity: Goal: Risk for impaired skin integrity will decrease Outcome: Progressing

## 2024-06-27 NOTE — ED Provider Notes (Signed)
 [place] [facility] AT [facility] Provider Note   CSN: [id number] Arrival date & time: 06/27/24  0848     Patient presents with: Emesis   Alejandra Schneider is a 68 y.o. female.    Emesis   Presents because of nausea and vomiting.  Patient states that she started vomiting yesterday.  Still having bowel movements.  Last bowel movement was yesterday.  Pain is abdominal surgery of cholecystectomy.  No chest pain or shortness of breath.  No fever no chills.  Patient states that she has generalized abdominal pain mostly epigastric area.  Patient states that has not really been able to tolerate p.o.  Did not take methadone  yesterday.  Did take her methadone  today and was able to keep it down for about 10 to 15 minutes before having episode of emesis.  Patient states that otherwise, she is feeling within her normal state of health.  Recently admitted because of COPD exacerbation but this is since resolved.  No chest pain or shortness of breath at this time.     Previous medical history reviewed : Patient was last admitted and discharged in September 2025.  Was admitted because of COPD exacerbation.   Prior to Admission medications   Medication Sig Start Date End Date Taking? Authorizing Provider  acetaminophen  (TYLENOL ) 500 MG tablet Take 1,000 mg by mouth every 6 (six) hours as needed for headache.    [provider]  amLODipine  (NORVASC ) 5 MG tablet Take 1 tablet (5 mg total) by mouth daily. 11/13/21   Terance Levada BRAVO, MD  clonazePAM  (KLONOPIN ) 0.5 MG tablet Take 1 tablet (0.5 mg total) by mouth 2 (two) times daily as needed for anxiety. Patient taking differently: Take 0.5 mg by mouth 2 (two) times daily. 10/13/12   Odella Rush, MD  cloNIDine  (CATAPRES ) 0.1 MG tablet Take 0.1 mg by mouth in the morning and at bedtime. 01/26/20   [provider]  enoxaparin  (LOVENOX ) 80 MG/0.8ML injection Inject 0.8 mLs (80 mg total) into the skin every 12 (twelve)  hours. 11/12/21   Terance Levada BRAVO, MD  levETIRAcetam  (KEPPRA ) 500 MG tablet Take 1 tablet (500 mg total) by mouth 2 (two) times daily. 11/12/21   Terance Levada BRAVO, MD  methadone  (DOLOPHINE ) 10 MG/5ML solution Take 150 mg by mouth daily.    [provider]  ondansetron  (ZOFRAN -ODT) 4 MG disintegrating tablet Take 1 tablet (4 mg total) by mouth every 8 (eight) hours as needed for nausea or vomiting. 11/10/21   Jerrol Agent, MD    Allergies: Diazepam, Codeine, Haldol [haloperidol decanoate], Penicillins, Phenobarbital, and Pentazocine    Review of Systems  Gastrointestinal:  Positive for vomiting.    Updated Vital Signs BP (!) 200/85   Pulse 61   Temp 97.7 F (36.5 C) (Oral)   Resp 18   Ht 5' (1.524 m)   Wt 72.6 kg   SpO2 95%   BMI 31.25 kg/m   Physical Exam Vitals and nursing note reviewed.  Constitutional:      General: She is not in acute distress.    Appearance: She is well-developed.  HENT:     Head: Normocephalic and atraumatic.  Eyes:     Conjunctiva/sclera: Conjunctivae normal.  Cardiovascular:     Rate and Rhythm: Normal rate and regular rhythm.     Heart sounds: No murmur heard. Pulmonary:     Effort: Pulmonary effort is normal. No respiratory distress.     Breath sounds: Normal breath sounds.  Abdominal:  Palpations: Abdomen is soft.     Tenderness: There is no abdominal tenderness.  Musculoskeletal:        General: No swelling.     Cervical back: Neck supple.  Skin:    General: Skin is warm and dry.     Capillary Refill: Capillary refill takes less than 2 seconds.  Neurological:     Mental Status: She is alert.  Psychiatric:        Mood and Affect: Mood normal.     (all labs ordered are listed, but only abnormal results are displayed) Labs Reviewed  COMPREHENSIVE METABOLIC PANEL WITH GFR - Abnormal; Notable for the following components:      Result Value   Potassium 3.3 (*)    Chloride 96 (*)    Glucose, Bld 264 (*)    Total  Protein 8.3 (*)    AST 54 (*)    Alkaline Phosphatase 164 (*)    All other components within normal limits  CBC WITH DIFFERENTIAL/PLATELET - Abnormal; Notable for the following components:   RBC 5.35 (*)    MCV 78.1 (*)    MCH 23.4 (*)    MCHC 29.9 (*)    RDW 17.5 (*)    Lymphs Abs 0.4 (*)    All other components within normal limits  URINALYSIS, ROUTINE W REFLEX MICROSCOPIC - Abnormal; Notable for the following components:   Glucose, UA 150 (*)    Ketones, ur 5 (*)    Leukocytes,Ua TRACE (*)    All other components within normal limits  LIPASE, BLOOD  MAGNESIUM  MAGNESIUM    EKG: EKG Interpretation Date/Time:  Tuesday June 27 2024 09:51:59 EDT Ventricular Rate:  68 PR Interval:  141 QRS Duration:  102 QT Interval:  499 QTC Calculation: 531 R Axis:   68  Text Interpretation: Sinus rhythm Probable left atrial enlargement Nonspecific T abnormalities, lateral leads Prolonged QT interval Confirmed by Simon Rea [phone]) on 06/27/2024 10:44:53 AM  Radiology: CT ABDOMEN PELVIS W CONTRAST Result Date: 06/27/2024 CLINICAL DATA:  Generalized abdominal pain.  Nausea and vomiting. EXAM: CT ABDOMEN AND PELVIS WITH CONTRAST TECHNIQUE: Multidetector CT imaging of the abdomen and pelvis was performed using the standard protocol following bolus administration of intravenous contrast. RADIATION DOSE REDUCTION: This exam was performed according to the departmental dose-optimization program which includes automated exposure control, adjustment of the mA and/or kV according to patient size and/or use of iterative reconstruction technique. CONTRAST:  OMNIPAQUE  IOHEXOL  300 MG/ML  SOLN COMPARISON:  CT abdomen pelvis dated 11/10/2021. FINDINGS: Lower chest: The visualized lung bases are clear. No intra-abdominal free air or free fluid. Hepatobiliary: The liver is unremarkable. There is mild dilatation, post cholecystectomy. No retained calcified stone noted in the central CBD. Pancreas:  Unremarkable. No pancreatic ductal dilatation or surrounding inflammatory changes. Spleen: Mildly enlarged spleen measuring 14 cm in length. Adrenals/Urinary Tract: The right adrenal glands unremarkable. There is a 1 cm indeterminate left adrenal nodule. Small bilateral renal cyst and additional subcentimeter hypodense lesions which are too small to characterize. There is no hydronephrosis on either side. There is symmetric enhancement and excretion of contrast by both kidneys. The visualized ureters and urinary bladder appear unremarkable. Stomach/Bowel: There is a large hiatal hernia containing the majority of the stomach and a segment of the transverse colon. Mild thickened appearance of the distal descending/sigmoid colon may be related to underdistention. Mild colitis is less likely but not excluded. There is no bowel obstruction. Appendectomy. Vascular/Lymphatic: Moderate aortoiliac atherosclerotic disease. The  IVC is unremarkable. An infrarenal IVC filter noted. No portal venous gas. There is no adenopathy. Reproductive: The uterus is anteverted and grossly unremarkable. No suspicious adnexal masses. Other: None Musculoskeletal: No acute osseous pathology. Bilateral hip arthritic changes. IMPRESSION: 1. Mild thickened appearance of the distal descending/sigmoid colon may be related to underdistention. Mild colitis is less likely but not excluded. No bowel obstruction. 2. Large hiatal hernia containing the majority of the stomach and a segment of the transverse colon. 3. Mild splenomegaly. 4.  Aortic Atherosclerosis (ICD10-I70.0). Electronically Signed   By: Vanetta Chou M.D.   On: 06/27/2024 14:04     Procedures   Medications Ordered in the ED  potassium chloride  10 mEq in 100 mL IVPB (10 mEq Intravenous New Bag/Given 06/27/24 1618)  enoxaparin  (LOVENOX ) injection 40 mg (has no administration in time range)  acetaminophen  (TYLENOL ) tablet 650 mg (has no administration in time range)    Or   acetaminophen  (TYLENOL ) suppository 650 mg (has no administration in time range)  traZODone  (DESYREL ) tablet 25 mg (has no administration in time range)  ondansetron  (ZOFRAN ) tablet 4 mg (has no administration in time range)    Or  ondansetron  (ZOFRAN ) injection 4 mg (has no administration in time range)  albuterol  (PROVENTIL ) (2.5 MG/3ML) 0.083% nebulizer solution 2.5 mg (has no administration in time range)  pantoprazole  (PROTONIX ) injection 40 mg (has no administration in time range)  0.9 %  sodium chloride  infusion ( Intravenous New Bag/Given 06/27/24 1620)  HYDROmorphone  (DILAUDID ) injection 0.5 mg (has no administration in time range)  LORazepam  (ATIVAN ) injection 1 mg (has no administration in time range)  cloNIDine  (CATAPRES ) tablet 0.1 mg (has no administration in time range)  hydrALAZINE  (APRESOLINE ) injection 5 mg (has no administration in time range)  levETIRAcetam  (KEPPRA ) undiluted injection 250 mg (has no administration in time range)  ondansetron  (ZOFRAN ) injection 4 mg (4 mg Intravenous Given 06/27/24 1001)  sodium chloride  0.9 % bolus 1,000 mL (1,000 mLs Intravenous New Bag/Given 06/27/24 0938)  famotidine  (PEPCID ) IVPB 20 mg premix (20 mg Intravenous New Bag/Given 06/27/24 1000)  iohexol  (OMNIPAQUE ) 300 MG/ML solution 100 mL (100 mLs Intravenous Contrast Given 06/27/24 1308)  midazolam  (VERSED ) injection 2 mg (2 mg Intravenous Given 06/27/24 1207)  midazolam  (VERSED ) injection 2 mg (2 mg Intravenous Given 06/27/24 1421)  hydrALAZINE  (APRESOLINE ) injection 5 mg (5 mg Intravenous Given 06/27/24 1559)  ketorolac  (TORADOL ) 15 MG/ML injection 15 mg (15 mg Intravenous Given 06/27/24 1602)                                    Medical Decision Making Amount and/or Complexity of Data Reviewed Labs: ordered. Radiology: ordered.  Risk Prescription drug management. Decision regarding hospitalization.    Presents because of nausea and vomiting.  Patient states that she started vomiting  yesterday.  Still having bowel movements.  Last bowel movement was yesterday.  Pain is abdominal surgery of cholecystectomy.  No chest pain or shortness of breath.  No fever no chills.  Patient states that she has generalized abdominal pain mostly epigastric area.  Patient states that has not really been able to tolerate p.o.  Did not take methadone  yesterday.  Did take her methadone  today and was able to keep it down for about 10 to 15 minutes before having episode of emesis.  Patient states that otherwise, she is feeling within her normal state of health.  Recently admitted because of COPD  exacerbation but this is since resolved.  No chest pain or shortness of breath at this time.  Previous medical history reviewed : Patient was last admitted and discharged in September 2025.  Was admitted because of COPD exacerbation.  Upon exam, patient ANO x 3 with GCS 15.  Hemodynamically stable.  Afebrile.  He has some pain to palpation epigastric area but otherwise soft and benign abdomen.  Patient's laboratory workup was no significant abnormalities.  No significant leukocytosis.  Potassium 3.3 which was replaced IV.  Otherwise, no large LFT derangements.  Obtain CT scan of the abdomen pelvis.  No significant pathology was seen other than maybe possible mild colitis versus underdistention of the colon.  Otherwise, unremarkable.  Does have a large hiatal hernia which could be contributing terms of GERD but do not think this is surgical emergency at this point time.  No strangulation.  Limited as to what kind of antiemetics I can give the patient.  Patient does have prolonged QTc.  Therefore, I do not want give too much Zofran  or other QTc prolonging agents.  Did give 1 dose of Zofran  but subsequently transition to Versed  IV pushes.  Subsequently still have episodes of emesis unable to tolerate p.o.  Therefore, patient will need to be admitted in the setting of ongoing emesis.              Final  diagnoses:  Nausea and vomiting, unspecified vomiting type  Hypokalemia    ED Discharge Orders     None          Simon Lavonia SAILOR, MD 06/27/24 [phone]

## 2024-06-27 NOTE — H&P (Signed)
 History and Physical  Alejandra Schneider FMW:[id number] DOB: Aug 03, 1956 DOA: 06/27/2024  PCP: [facility] (Inactive)   Chief Complaint: Vomiting  HPI: Alejandra Schneider is a 68 y.o. female with medical history significant for seizures, anxiety, narcotic abuse on methadone  and COPD on room air recently discharged from [facility] for COPD exacerbation now being admitted to the hospital with intractable nausea and vomiting.  Patient states she has never had such severe vomiting before, started this morning about 5 AM and has been persistent all day.  She has been unable to keep anything down, went to methadone  clinic today and vomited there about 10 or 15 minutes after taking her dose of methadone .  Denies any fevers or chills, no significant abdominal pain, states she has been having normal bowel movements.  Review of Systems: Please see HPI for pertinent positives and negatives. A complete 10 system review of systems are otherwise negative.  Past Medical History:  Diagnosis Date   Deep vein thrombosis (DVT) (HCC)    Seizures (HCC)    Past Surgical History:  Procedure Laterality Date   CHOLECYSTECTOMY     Social History:  reports that she has been smoking cigarettes. She does not have any smokeless tobacco history on file. She reports that she does not drink alcohol and does not use drugs.  Allergies  Allergen Reactions   Diazepam Other (See Comments)    Agitation   Codeine Itching   Haldol [Haloperidol Decanoate] Swelling   Penicillins Hives   Phenobarbital Itching   Pentazocine Anxiety, Nausea And Vomiting and Rash    Family History  Problem Relation Age of Onset   Alzheimer's disease Mother    COPD Mother    ALS Sister      Prior to Admission medications   Medication Sig Start Date End Date Taking? Authorizing Provider  acetaminophen  (TYLENOL ) 500 MG tablet Take 1,000 mg by mouth every 6 (six) hours as needed for headache.    [provider]  amLODipine  (NORVASC ) 5 MG tablet Take 1 tablet (5 mg total) by mouth daily. 11/13/21   Terance Levada BRAVO, MD  clonazePAM  (KLONOPIN ) 0.5 MG tablet Take 1 tablet (0.5 mg total) by mouth 2 (two) times daily as needed for anxiety. Patient taking differently: Take 0.5 mg by mouth 2 (two) times daily. 10/13/12   Odella Rush, MD  cloNIDine  (CATAPRES ) 0.1 MG tablet Take 0.1 mg by mouth in the morning and at bedtime. 01/26/20   [provider]  enoxaparin  (LOVENOX ) 80 MG/0.8ML injection Inject 0.8 mLs (80 mg total) into the skin every 12 (twelve) hours. 11/12/21   Terance Levada BRAVO, MD  levETIRAcetam  (KEPPRA ) 500 MG tablet Take 1 tablet (500 mg total) by mouth 2 (two) times daily. 11/12/21   Terance Levada BRAVO, MD  methadone  (DOLOPHINE ) 10 MG/5ML solution Take 150 mg by mouth daily.    [provider]  ondansetron  (ZOFRAN -ODT) 4 MG disintegrating tablet Take 1 tablet (4 mg total) by mouth every 8 (eight) hours as needed for nausea or vomiting. 11/10/21   Jerrol Agent, MD    Physical Exam: BP (!) 200/85   Pulse 61   Temp 97.7 F (36.5 C) (Oral)   Resp 18   Ht 5' (1.524 m)   Wt 72.6 kg   SpO2 95%   BMI 31.25 kg/m  General:  Alert, oriented, calm, in no acute distress  Eyes: EOMI, clear conjuctivae, white sclerea Neck: supple, no masses, trachea mildline  Cardiovascular: RRR,  no murmurs or rubs, no peripheral edema  Respiratory: clear to auscultation bilaterally, no wheezes, no crackles  Abdomen: soft, nontender, nondistended, normal bowel tones heard  Skin: dry, no rashes  Musculoskeletal: no joint effusions, normal range of motion  Psychiatric: appropriate affect, normal speech  Neurologic: extraocular muscles intact, clear speech, moving all extremities with intact sensorium         Labs on Admission:  Basic Metabolic Panel: Recent Labs  Lab 06/27/24 0902  NA 137  K 3.3*  CL 96*  CO2 26  GLUCOSE 264*  BUN 10  CREATININE 0.64  CALCIUM 9.7  MG  1.9   Liver Function Tests: Recent Labs  Lab 06/27/24 0902  AST 54*  ALT 36  ALKPHOS 164*  BILITOT 0.5  PROT 8.3*  ALBUMIN 4.3   Recent Labs  Lab 06/27/24 0902  LIPASE 22   No results for input(s): AMMONIA in the last 168 hours. CBC: Recent Labs  Lab 06/27/24 0902  WBC 4.0  NEUTROABS 3.3  HGB 12.5  HCT 41.8  MCV 78.1*  PLT 163   Cardiac Enzymes: No results for input(s): CKTOTAL, CKMB, CKMBINDEX, TROPONINI in the last 168 hours. BNP (last 3 results) No results for input(s): BNP in the last 8760 hours.  ProBNP (last 3 results) No results for input(s): PROBNP in the last 8760 hours.  CBG: No results for input(s): GLUCAP in the last 168 hours.  Radiological Exams on Admission: CT ABDOMEN PELVIS W CONTRAST Result Date: 06/27/2024 CLINICAL DATA:  Generalized abdominal pain.  Nausea and vomiting. EXAM: CT ABDOMEN AND PELVIS WITH CONTRAST TECHNIQUE: Multidetector CT imaging of the abdomen and pelvis was performed using the standard protocol following bolus administration of intravenous contrast. RADIATION DOSE REDUCTION: This exam was performed according to the departmental dose-optimization program which includes automated exposure control, adjustment of the mA and/or kV according to patient size and/or use of iterative reconstruction technique. CONTRAST:  OMNIPAQUE  IOHEXOL  300 MG/ML  SOLN COMPARISON:  CT abdomen pelvis dated 11/10/2021. FINDINGS: Lower chest: The visualized lung bases are clear. No intra-abdominal free air or free fluid. Hepatobiliary: The liver is unremarkable. There is mild dilatation, post cholecystectomy. No retained calcified stone noted in the central CBD. Pancreas: Unremarkable. No pancreatic ductal dilatation or surrounding inflammatory changes. Spleen: Mildly enlarged spleen measuring 14 cm in length. Adrenals/Urinary Tract: The right adrenal glands unremarkable. There is a 1 cm indeterminate left adrenal nodule. Small bilateral  renal cyst and additional subcentimeter hypodense lesions which are too small to characterize. There is no hydronephrosis on either side. There is symmetric enhancement and excretion of contrast by both kidneys. The visualized ureters and urinary bladder appear unremarkable. Stomach/Bowel: There is a large hiatal hernia containing the majority of the stomach and a segment of the transverse colon. Mild thickened appearance of the distal descending/sigmoid colon may be related to underdistention. Mild colitis is less likely but not excluded. There is no bowel obstruction. Appendectomy. Vascular/Lymphatic: Moderate aortoiliac atherosclerotic disease. The IVC is unremarkable. An infrarenal IVC filter noted. No portal venous gas. There is no adenopathy. Reproductive: The uterus is anteverted and grossly unremarkable. No suspicious adnexal masses. Other: None Musculoskeletal: No acute osseous pathology. Bilateral hip arthritic changes. IMPRESSION: 1. Mild thickened appearance of the distal descending/sigmoid colon may be related to underdistention. Mild colitis is less likely but not excluded. No bowel obstruction. 2. Large hiatal hernia containing the majority of the stomach and a segment of the transverse colon. 3. Mild splenomegaly. 4.  Aortic Atherosclerosis (ICD10-I70.0).  Electronically Signed   By: Vanetta Chou M.D.   On: 06/27/2024 14:04   Assessment/Plan Alejandra Schneider is a 68 y.o. female with medical history significant for seizures, anxiety, narcotic abuse on methadone  and COPD on room air recently discharged from [facility] for COPD exacerbation now being admitted to the hospital with intractable nausea and vomiting.  Intractable nausea and vomiting-without fever, or diarrhea.  CT scan with questionable finding of colitis but no significant lower GI symptoms.  Likely viral gastroenteritis. -Observation admission -IV fluids -Pain and nausea medication as needed -IV Protonix   daily  Hypokalemia-mild, repleted in the emergency department  Narcotic dependence-patient states she takes 205 mg methadone  daily, took it this morning at the clinic but vomited soon thereafter -Will resume home methadone  once dosing is confirmed, and able to tolerate p.o.  COPD-stable on room air without symptoms of exacerbation -Albuterol  nebulizer as needed  Seizure disorder-continue Keppra , convert to IV for the time being  Essential hypertension-clonidine  twice daily, with IV hydralazine  as needed  DVT prophylaxis: Lovenox      Code Status: Full Code  Consults called: None  Admission status: Observation  Time spent: 49 minutes  Domanick Cuccia CHRISTELLA Gail MD [facility] Pager [phone]  If 7PM-7AM, please contact night-coverage [email or web address] Password [email or web address]  06/27/2024, 4:10 PM

## 2024-06-28 DIAGNOSIS — G4089 Other seizures: Secondary | ICD-10-CM | POA: Diagnosis not present

## 2024-06-28 DIAGNOSIS — E876 Hypokalemia: Secondary | ICD-10-CM | POA: Diagnosis not present

## 2024-06-28 DIAGNOSIS — J449 Chronic obstructive pulmonary disease, unspecified: Secondary | ICD-10-CM | POA: Diagnosis not present

## 2024-06-28 DIAGNOSIS — K529 Noninfective gastroenteritis and colitis, unspecified: Secondary | ICD-10-CM | POA: Diagnosis not present

## 2024-06-28 DIAGNOSIS — R1110 Vomiting, unspecified: Secondary | ICD-10-CM | POA: Diagnosis not present

## 2024-06-28 LAB — BASIC METABOLIC PANEL WITH GFR
Anion gap: 12 (ref 5–15)
BUN: 12 mg/dL (ref 8–23)
CO2: 23 mmol/L (ref 22–32)
Calcium: 8.4 mg/dL — ABNORMAL LOW (ref 8.9–10.3)
Chloride: 100 mmol/L (ref 98–111)
Creatinine, Ser: 0.66 mg/dL (ref 0.44–1.00)
GFR, Estimated: >60 mL/min (ref >60)
Glucose, Bld: 75 mg/dL (ref 70–99)
Potassium: 3.5 mmol/L (ref 3.5–5.1)
Sodium: 135 mmol/L (ref 135–145)

## 2024-06-28 LAB — CBC
HCT: 32.6 % — ABNORMAL LOW (ref 36.0–46.0)
Hemoglobin: 9.5 g/dL — ABNORMAL LOW (ref 12.0–15.0)
MCH: 23.2 pg — ABNORMAL LOW (ref 26.0–34.0)
MCHC: 29.1 g/dL — ABNORMAL LOW (ref 30.0–36.0)
MCV: 79.5 fL — ABNORMAL LOW (ref 80.0–100.0)
Platelets: 131 K/uL — ABNORMAL LOW (ref 150–400)
RBC: 4.1 MIL/uL (ref 3.87–5.11)
RDW: 17.3 % — ABNORMAL HIGH (ref 11.5–15.5)
WBC: 2.8 K/uL — ABNORMAL LOW (ref 4.0–10.5)
nRBC: 0 % (ref 0.0–0.2)

## 2024-06-28 LAB — HIV ANTIBODY (ROUTINE TESTING W REFLEX): HIV Screen 4th Generation wRfx: NONREACTIVE

## 2024-06-28 MED ORDER — LEVETIRACETAM ER 500 MG PO TB24
500.0000 mg | ORAL_TABLET | Freq: Every day | ORAL | Status: DC
  Administered 2024-06-29: 500 mg via ORAL
  Filled 2024-06-28 (×3): qty 1

## 2024-06-28 MED ORDER — PANTOPRAZOLE SODIUM 40 MG PO TBEC
40.0000 mg | DELAYED_RELEASE_TABLET | Freq: Every day | ORAL | Status: DC
  Administered 2024-06-29: 40 mg via ORAL
  Filled 2024-06-28 (×2): qty 1

## 2024-06-28 MED ORDER — METHADONE HCL 10 MG/ML PO CONC
205.0000 mg | Freq: Every day | ORAL | Status: DC
  Administered 2024-06-28 – 2024-06-29 (×2): 205 mg via ORAL
  Filled 2024-06-28 (×2): qty 25

## 2024-06-28 NOTE — Plan of Care (Signed)

## 2024-06-28 NOTE — Progress Notes (Signed)
 Upon doing medication round aprox. 0900 ish went into pt room to give her the am meds explained that the MD had not put in the methadone  yet and I was still in contact with him. I gave her the cup of meds she in turn tossed them back and stated I don't understand how this is and just because they don't like it they wont give it. Md made aware of situation.

## 2024-06-28 NOTE — Progress Notes (Addendum)
 PROGRESS NOTE  Alejandra Schneider  FMW:[id number] DOB: [birth year] DOA: 06/27/2024 PCP: [facility] (Inactive)   Brief Narrative: Patient is a 68 year old female with history of seizure disorder, anxiety, narcotic abuse on methadone , COPD who was recently admitted at [facility] health for COPD exacerbation now presented with intractable nausea/vomiting, unable to keep things down.  Patient admitted for the management of tractable nausea, vomiting.  Nausea, vomiting has improved.  Advancing diet to soft today.  Possible discharge in a.m.  Assessment & Plan:  Principal Problem:   Intractable vomiting   Intractable nausea/vomiting: No abdominal pain, fever or loose stools.  CT showed questionable finding of colitis but no significant lower GI symptoms.  Likely viral gastroenteritis. Nausea, vomiting has improved.  Abdomen is benign on examination.  Diet advanced to soft.  Hypokalemia: Supplemented and corrected  Narcotic dependence:Takes 205 mg of methadone  daily.  Will restart.  She says she has taken methadone  since 23 years.  Has history of drug abuse  in the past but not in remission  COPD: Currently without exacerbation.  Seizure disorder: On Keppra   Hypertension:On clonidine   twice daily  Smoker: A pack of cigarette last for 3 days.  Counseled for cessation  Prolonged QTc: QTc of 531 yesterday.  Follow-up new EKG today         DVT prophylaxis:enoxaparin  (LOVENOX ) injection 40 mg Start: 06/27/24 1800 SCDs Start: 06/27/24 1607     Code Status: Full Code  Family Communication: None at bedside  Patient status:Inpatient  Patient is from :Home  Anticipated discharge un:Ynfz  Estimated DC date:tomorrow   Consultants: None  Procedures:None  Antimicrobials:  Anti-infectives (From admission, onward)    None       Subjective: Patient seen and examined at bedside today.  Hemodynamically stable.  Complains of some nausea but no vomiting.   She had epigastric pain earlier but better.  Feels ready to advance her diet to soft.  Objective: Vitals:   06/27/24 1559 06/27/24 2033 06/27/24 2317 06/28/24 0320  BP: (!) 200/85 (!) 142/81 131/69 (!) 146/83  Pulse:  72 64 64  Resp:  16 18 16   Temp:  98.5 F (36.9 C) 98.3 F (36.8 C) 98.4 F (36.9 C)  TempSrc:      SpO2:      Weight:      Height:        Intake/Output Summary (Last 24 hours) at 06/28/2024 0828 Last data filed at 06/27/2024 1800 Gross per 24 hour  Intake 294.98 ml  Output --  Net 294.98 ml   Filed Weights   06/27/24 0854  Weight: 72.6 kg    Examination:  General exam: Overall comfortable, not in distress, obese HEENT: PERRL Respiratory system:  no wheezes or crackles  Cardiovascular system: S1 & S2 heard, RRR.  Gastrointestinal system: Abdomen is nondistended, soft and mostly nontender.  Bowel sounds present Central nervous system: Alert and oriented Extremities: No edema, no clubbing ,no cyanosis Skin: No rashes, no ulcers,no icterus     Data Reviewed: I have personally reviewed following labs and imaging studies  CBC: Recent Labs  Lab 06/27/24 0902 06/28/24 0434  WBC 4.0 2.8*  NEUTROABS 3.3  --   HGB 12.5 9.5*  HCT 41.8 32.6*  MCV 78.1* 79.5*  PLT 163 131*   Basic Metabolic Panel: Recent Labs  Lab 06/27/24 0902 06/27/24 2107 06/28/24 0434  NA 137  --  135  K 3.3*  --  3.5  CL 96*  --  100  CO2 26  --  23  GLUCOSE 264*  --  75  BUN 10  --  12  CREATININE 0.64  --  0.66  CALCIUM 9.7  --  8.4*  MG 1.9 1.8  --      No results found for this or any previous visit (from the past 240 hours).   Radiology Studies: CT ABDOMEN PELVIS W CONTRAST Result Date: 06/27/2024 CLINICAL DATA:  Generalized abdominal pain.  Nausea and vomiting. EXAM: CT ABDOMEN AND PELVIS WITH CONTRAST TECHNIQUE: Multidetector CT imaging of the abdomen and pelvis was performed using the standard protocol following bolus administration of intravenous contrast.  RADIATION DOSE REDUCTION: This exam was performed according to the departmental dose-optimization program which includes automated exposure control, adjustment of the mA and/or kV according to patient size and/or use of iterative reconstruction technique. CONTRAST:  OMNIPAQUE  IOHEXOL  300 MG/ML  SOLN COMPARISON:  CT abdomen pelvis dated 11/10/2021. FINDINGS: Lower chest: The visualized lung bases are clear. No intra-abdominal free air or free fluid. Hepatobiliary: The liver is unremarkable. There is mild dilatation, post cholecystectomy. No retained calcified stone noted in the central CBD. Pancreas: Unremarkable. No pancreatic ductal dilatation or surrounding inflammatory changes. Spleen: Mildly enlarged spleen measuring 14 cm in length. Adrenals/Urinary Tract: The right adrenal glands unremarkable. There is a 1 cm indeterminate left adrenal nodule. Small bilateral renal cyst and additional subcentimeter hypodense lesions which are too small to characterize. There is no hydronephrosis on either side. There is symmetric enhancement and excretion of contrast by both kidneys. The visualized ureters and urinary bladder appear unremarkable. Stomach/Bowel: There is a large hiatal hernia containing the majority of the stomach and a segment of the transverse colon. Mild thickened appearance of the distal descending/sigmoid colon may be related to underdistention. Mild colitis is less likely but not excluded. There is no bowel obstruction. Appendectomy. Vascular/Lymphatic: Moderate aortoiliac atherosclerotic disease. The IVC is unremarkable. An infrarenal IVC filter noted. No portal venous gas. There is no adenopathy. Reproductive: The uterus is anteverted and grossly unremarkable. No suspicious adnexal masses. Other: None Musculoskeletal: No acute osseous pathology. Bilateral hip arthritic changes. IMPRESSION: 1. Mild thickened appearance of the distal descending/sigmoid colon may be related to underdistention. Mild  colitis is less likely but not excluded. No bowel obstruction. 2. Large hiatal hernia containing the majority of the stomach and a segment of the transverse colon. 3. Mild splenomegaly. 4.  Aortic Atherosclerosis (ICD10-I70.0). Electronically Signed   By: Vanetta Chou M.D.   On: 06/27/2024 14:04    Scheduled Meds:  cloNIDine   0.1 mg Oral BID   enoxaparin  (LOVENOX ) injection  40 mg Subcutaneous Q24H   levETIRAcetam   250 mg Intravenous Q12H   pantoprazole  (PROTONIX ) IV  40 mg Intravenous Daily   Continuous Infusions:  sodium chloride  100 mL/hr at 06/27/24 2155     LOS: 0 days   Ivonne Mustache, MD [facility] P9/07/2024, 8:28 AM

## 2024-06-28 NOTE — Progress Notes (Signed)
 Upon doing 7am rounds pt expressed adamantly that she wanted her Methadone .I explained to her that her would be unlikely to get both dilaudid  and methadone .She stated did not want the dilaudid  but did want the methadone .Md made aware of her request.

## 2024-06-29 DIAGNOSIS — K529 Noninfective gastroenteritis and colitis, unspecified: Secondary | ICD-10-CM | POA: Diagnosis not present

## 2024-06-29 DIAGNOSIS — R1110 Vomiting, unspecified: Secondary | ICD-10-CM | POA: Diagnosis not present

## 2024-06-29 MED ORDER — SODIUM CHLORIDE 0.9 % IV SOLN: INTRAVENOUS | Status: DC

## 2024-06-29 MED ORDER — PHENYTOIN SODIUM EXTENDED 100 MG PO CAPS
200.0000 mg | ORAL_CAPSULE | Freq: Two times a day (BID) | ORAL | Status: DC
  Administered 2024-06-29 (×2): 200 mg via ORAL
  Filled 2024-06-29 (×4): qty 2

## 2024-06-29 MED ORDER — CLONAZEPAM 0.5 MG PO TABS
0.5000 mg | ORAL_TABLET | Freq: Every evening | ORAL | Status: DC | PRN
  Administered 2024-06-29: 0.5 mg via ORAL
  Filled 2024-06-29: qty 1

## 2024-06-29 NOTE — Progress Notes (Addendum)
 PROGRESS NOTE  Alejandra Schneider  FMW:[id number] DOB: Feb 22, 1956 DOA: 06/27/2024 PCP: [facility] (Inactive)   Brief Narrative: Patient is a 68 year old female with history of seizure disorder, anxiety, narcotic abuse on methadone , COPD who was recently admitted at [facility] for COPD exacerbation now presented with intractable nausea/vomiting, unable to keep things down.  Patient admitted for the management of tractable nausea, vomiting.  Nausea, vomiting has improved.  Advancing diet to soft .  Still complains of nausea, abdominal discomfort.  Had 2 loose bowel movements today.  Possible discharge in a.m if clinically improves.  Assessment & Plan:  Principal Problem:   Intractable vomiting   Intractable nausea/vomiting/colitis:   CT showed questionable finding of colitis but no significant lower GI symptoms on admission.  Still having some abdominal discomfort but abdomen is mostly benign on examination.  Good bowel sounds.  Complains of nausea but no vomiting.  Had 2 loose bowel movements today ,will check GI pathogen panel.  Continue soft diet.  Continue to monitor off antibiotics.  No fever or leukocytosis  Hypokalemia: Supplemented and corrected  Narcotic dependence:Takes 205 mg of methadone  daily.  Will restart.  She says she has taken methadone  since 23 years.  Has history of drug abuse  in the past but not in remission  COPD: Currently without exacerbation.  Seizure disorder: On Keppra   Hypertension:On clonidine   twice daily  Smoker: A pack of cigarette last for 3 days.  Counseled for cessation  Prolonged QTc: QTc of 531 yesterday.   new EKG showed Qtc of 455         DVT prophylaxis:enoxaparin  (LOVENOX ) injection 40 mg Start: 06/27/24 1800 SCDs Start: 06/27/24 1607     Code Status: Full Code  Family Communication: None at bedside  Patient status:Inpatient  Patient is from :Home  Anticipated discharge un:Ynfz  Estimated DC  date:tomorrow   Consultants: None  Procedures:None  Antimicrobials:  Anti-infectives (From admission, onward)    None       Subjective: Patient seen and examined at bedside today.  She feels slightly better than yesterday but had 2 loose bowel movements and still having some nausea.  Some abdominal discomfort but not severe.  Has good bowel sounds.  Abdomen not significantly distended or tender.  She wants to stay another day  Objective: Vitals:   06/28/24 0320 06/28/24 2007 06/28/24 2126 06/29/24 0900  BP: (!) 146/83 (!) 150/76 (!) 150/76 (!) 170/70  Pulse: 64 73    Resp: 16 14    Temp: 98.4 F (36.9 C) 98.5 F (36.9 C)    TempSrc:  Oral    SpO2:      Weight:      Height:        Intake/Output Summary (Last 24 hours) at 06/29/2024 1143 Last data filed at 06/28/2024 2100 Gross per 24 hour  Intake 360 ml  Output --  Net 360 ml   Filed Weights   06/27/24 0854  Weight: 72.6 kg    Examination:   General exam: Overall comfortable, not in distress,obese HEENT: PERRL Respiratory system:  no wheezes or crackles  Cardiovascular system: S1 & S2 heard, RRR.  Gastrointestinal system: Abdomen is nondistended, soft and mostly nontender.  Bowel sounds present Central nervous system: Alert and oriented Extremities: No edema, no clubbing ,no cyanosis Skin: No rashes, no ulcers,no icterus      Data Reviewed: I have personally reviewed following labs and imaging studies  CBC: Recent Labs  Lab 06/27/24 0902 06/28/24  0434  WBC 4.0 2.8*  NEUTROABS 3.3  --   HGB 12.5 9.5*  HCT 41.8 32.6*  MCV 78.1* 79.5*  PLT 163 131*   Basic Metabolic Panel: Recent Labs  Lab 06/27/24 0902 06/27/24 2107 06/28/24 0434  NA 137  --  135  K 3.3*  --  3.5  CL 96*  --  100  CO2 26  --  23  GLUCOSE 264*  --  75  BUN 10  --  12  CREATININE 0.64  --  0.66  CALCIUM 9.7  --  8.4*  MG 1.9 1.8  --      No results found for this or any previous visit (from the past 240 hours).    Radiology Studies: CT ABDOMEN PELVIS W CONTRAST Result Date: 06/27/2024 CLINICAL DATA:  Generalized abdominal pain.  Nausea and vomiting. EXAM: CT ABDOMEN AND PELVIS WITH CONTRAST TECHNIQUE: Multidetector CT imaging of the abdomen and pelvis was performed using the standard protocol following bolus administration of intravenous contrast. RADIATION DOSE REDUCTION: This exam was performed according to the departmental dose-optimization program which includes automated exposure control, adjustment of the mA and/or kV according to patient size and/or use of iterative reconstruction technique. CONTRAST:  OMNIPAQUE  IOHEXOL  300 MG/ML  SOLN COMPARISON:  CT abdomen pelvis dated 11/10/2021. FINDINGS: Lower chest: The visualized lung bases are clear. No intra-abdominal free air or free fluid. Hepatobiliary: The liver is unremarkable. There is mild dilatation, post cholecystectomy. No retained calcified stone noted in the central CBD. Pancreas: Unremarkable. No pancreatic ductal dilatation or surrounding inflammatory changes. Spleen: Mildly enlarged spleen measuring 14 cm in length. Adrenals/Urinary Tract: The right adrenal glands unremarkable. There is a 1 cm indeterminate left adrenal nodule. Small bilateral renal cyst and additional subcentimeter hypodense lesions which are too small to characterize. There is no hydronephrosis on either side. There is symmetric enhancement and excretion of contrast by both kidneys. The visualized ureters and urinary bladder appear unremarkable. Stomach/Bowel: There is a large hiatal hernia containing the majority of the stomach and a segment of the transverse colon. Mild thickened appearance of the distal descending/sigmoid colon may be related to underdistention. Mild colitis is less likely but not excluded. There is no bowel obstruction. Appendectomy. Vascular/Lymphatic: Moderate aortoiliac atherosclerotic disease. The IVC is unremarkable. An infrarenal IVC filter noted. No  portal venous gas. There is no adenopathy. Reproductive: The uterus is anteverted and grossly unremarkable. No suspicious adnexal masses. Other: None Musculoskeletal: No acute osseous pathology. Bilateral hip arthritic changes. IMPRESSION: 1. Mild thickened appearance of the distal descending/sigmoid colon may be related to underdistention. Mild colitis is less likely but not excluded. No bowel obstruction. 2. Large hiatal hernia containing the majority of the stomach and a segment of the transverse colon. 3. Mild splenomegaly. 4.  Aortic Atherosclerosis (ICD10-I70.0). Electronically Signed   By: Vanetta Chou M.D.   On: 06/27/2024 14:04    Scheduled Meds:  cloNIDine   0.1 mg Oral BID   enoxaparin  (LOVENOX ) injection  40 mg Subcutaneous Q24H   levETIRAcetam   500 mg Oral Daily   methadone   205 mg Oral Daily   pantoprazole   40 mg Oral Daily   Continuous Infusions:     LOS: 0 days   Ivonne Mustache, MD [facility] P9/08/2024, 11:43 AM

## 2024-06-29 NOTE — Progress Notes (Signed)
 Attempted to ask patient admission questions, but patient refused at this time.

## 2024-06-29 NOTE — Care Management Obs Status (Signed)
 MEDICARE OBSERVATION STATUS NOTIFICATION   Patient Details  Name: Alejandra Schneider MRN: [id number] Date of Birth: 1956-10-11   Medicare Observation Status Notification Given:  Yes    Doneta Glenys DASEN, RN 06/29/2024, 10:12 AM

## 2024-06-29 NOTE — TOC Initial Note (Addendum)
 Transition of Care [facility]) - Initial/Assessment Note    Patient Details  Name: Alejandra Schneider MRN: [id number] Date of Birth: 03/12/1956  [facility] CM/SW Contact:    Doneta Glenys DASEN, RN Phone Number: 06/29/2024, 10:19 AM  Clinical Narrative:                 MOON completed. Patient states PTA lives with brother Salomon in a house. PCP/insurances verified;DME- oxygen, denies HH,. Patient is concerned about trans[portation methodone treatment and transportation home. CM called [facility] [phone] left message for Olam Ryder counsel..  IP CM will follow. 1:46 PM CM called [facility] again spoke with Olam Ryder. Olam states that the patient needs to arrive by 10:30 am to receive methadone .  CM requested patient call to arrange transportation from the clinic to [place] home.   Expected Discharge Plan: Home/Self Care Barriers to Discharge: Continued Medical Work up   Patient Goals and CMS Choice Patient states their goals for this hospitalization and ongoing recovery are:: home CMS Medicare.gov Compare Post Acute Care list provided to::  (NA) Choice offered to / list presented to : NA [place] ownership interest in [facility].provided to:: Parent NA    Expected Discharge Plan and Services In-house Referral: NA Discharge Planning Services: CM Consult, Follow-up appt scheduled, Other - See comment ([facility]) Post Acute Care Choice: NA Living arrangements for the past 2 months: Single Family Home                 DME Arranged: N/A DME Agency: NA       HH Arranged: NA HH Agency: NA        Prior Living Arrangements/Services Living arrangements for the past 2 months: Single Family Home Lives with:: Siblings Patient language and need for interpreter reviewed:: Yes Do you feel safe going back to the place where you live?: Yes      Need for Family Participation in Patient Care: Yes (Comment) Care giver support system in  place?: Yes (comment) Current home services:  (na) Criminal Activity/Legal Involvement Pertinent to Current Situation/Hospitalization: No - Comment as needed  Activities of Daily Living   ADL Screening (condition at time of admission) Independently performs ADLs?: Yes (appropriate for developmental age) Is the patient deaf or have difficulty hearing?: No Does the patient have difficulty seeing, even when wearing glasses/contacts?: No Does the patient have difficulty concentrating, remembering, or making decisions?: No  Permission Sought/Granted Permission sought to share information with : Case Manager Permission granted to share information with : Yes, Verbal Permission Granted  Share Information with NAME: Silvestre Agent (Brother)  [phone]  Permission granted to share info w AGENCY: [facility]        Emotional Assessment Appearance:: Appears stated age Attitude/Demeanor/Rapport: Engaged Affect (typically observed): Anxious, Apprehensive, Appropriate Orientation: : Oriented to Self, Oriented to Place, Oriented to  Time Alcohol / Substance Use: Illicit Drugs, Not Applicable Psych Involvement: No (comment)  Admission diagnosis:  Hypokalemia [E87.6] Intractable vomiting [R11.10] Nausea and vomiting, unspecified vomiting type [R11.2] Patient Active Problem List   Diagnosis Date Noted   Intractable vomiting 06/27/2024   Syncope 11/11/2021   Syncope and collapse 11/10/2021   PCP:  [place], [facility] [place] (Inactive) Pharmacy:   [facility] [address], [place] - [address] [place] [address] Phone: [phone] Fax: [phone]     Social Drivers of Health (SDOH) Social History: SDOH Screenings   Food Insecurity:  No Food Insecurity (06/27/2024)  Housing: Low Risk  (06/27/2024)  Transportation Needs: No Transportation Needs (06/27/2024)  Utilities: Not At Risk (06/27/2024)  Financial Resource Strain:  Patient Declined (06/16/2024)   Received from [facility]  Physical Activity: Unknown (11/30/2022)   Received from [facility]  Social Connections: Socially Isolated (06/27/2024)  Stress: No Stress Concern Present (06/24/2024)   Received from [facility]  Tobacco Use: High Risk (06/27/2024)   SDOH Interventions:     Readmission Risk Interventions     No data to display

## 2024-06-29 NOTE — Plan of Care (Signed)

## 2024-06-29 NOTE — Progress Notes (Signed)
 RN spoke to EDSW to follow up regarding pt's discharge in the AM for safe transportation to Methadone  clinic. EDSW stated that she will follow up to see if arrangements were made. RN will await further information.

## 2024-06-30 DIAGNOSIS — R1110 Vomiting, unspecified: Secondary | ICD-10-CM | POA: Diagnosis not present

## 2024-06-30 DIAGNOSIS — K529 Noninfective gastroenteritis and colitis, unspecified: Secondary | ICD-10-CM | POA: Diagnosis not present

## 2024-06-30 LAB — BASIC METABOLIC PANEL WITH GFR
Anion gap: 11 (ref 5–15)
BUN: 20 mg/dL (ref 8–23)
CO2: 27 mmol/L (ref 22–32)
Calcium: 8.8 mg/dL — ABNORMAL LOW (ref 8.9–10.3)
Chloride: 102 mmol/L (ref 98–111)
Creatinine, Ser: 0.83 mg/dL (ref 0.44–1.00)
GFR, Estimated: >60 mL/min (ref >60)
Glucose, Bld: 92 mg/dL (ref 70–99)
Potassium: 3.2 mmol/L — ABNORMAL LOW (ref 3.5–5.1)
Sodium: 139 mmol/L (ref 135–145)

## 2024-06-30 MED ORDER — POTASSIUM CHLORIDE CRYS ER 20 MEQ PO TBCR
40.0000 meq | EXTENDED_RELEASE_TABLET | Freq: Once | ORAL | Status: AC
  Administered 2024-06-30: 40 meq via ORAL
  Filled 2024-06-30: qty 2

## 2024-06-30 MED ORDER — CLONIDINE HCL 0.1 MG PO TABS: 0.1000 mg | ORAL_TABLET | Freq: Two times a day (BID) | ORAL | 0 refills | Status: AC

## 2024-06-30 MED ORDER — PANTOPRAZOLE SODIUM 40 MG PO TBEC: 40.0000 mg | DELAYED_RELEASE_TABLET | Freq: Every day | ORAL | 0 refills | Status: AC

## 2024-06-30 MED ORDER — LEVETIRACETAM ER 500 MG PO TB24: 500.0000 mg | ORAL_TABLET | Freq: Every day | ORAL | 0 refills | Status: AC

## 2024-06-30 MED ORDER — PHENYTOIN SODIUM EXTENDED 200 MG PO CAPS: 200.0000 mg | ORAL_CAPSULE | Freq: Two times a day (BID) | ORAL | 0 refills | Status: DC

## 2024-06-30 NOTE — Plan of Care (Signed)
 Pt given, educated on, and understands AVS discharge instructions.  All IV accesses dc'd.  Safe transport to pick up pt around 0740 per case manager.

## 2024-06-30 NOTE — TOC Transition Note (Addendum)
 Transition of Care [facility]) - Discharge Note   Patient Details  Name: Alejandra Schneider MRN: [id number] Date of Birth: 02-14-1956  [facility]) CM/SW Contact:  Doneta Glenys DASEN, RN Phone Number: 06/30/2024, 7:31 AM   Clinical Narrative:    CM called to set-up Safe Transport for patient to [facility], [address]  [place], [place] [address] for 7:40 AM pick-up. Patient has to be at clinic by 8:00 am to have a ride home. Patient set up the ride from the clinic to home in [place]. No additional needs identified. IP CM signing off.  Final next level of care: Home/Self Care Barriers to Discharge: Barriers Resolved   Patient Goals and CMS Choice Patient states their goals for this hospitalization and ongoing recovery are:: home CMS Medicare.gov Compare Post Acute Care list provided to::  (NA) Choice offered to / list presented to : NA [place] ownership interest in [facility].provided to:: Parent NA    Discharge Placement                       Discharge Plan and Services Additional resources added to the After Visit Summary for   In-house Referral: NA Discharge Planning Services: CM Consult, Follow-up appt scheduled, Other - See comment ([facility]) Post Acute Care Choice: NA          DME Arranged: N/A DME Agency: NA       HH Arranged: NA HH Agency: NA        Social Drivers of Health (SDOH) Interventions SDOH Screenings   Food Insecurity: No Food Insecurity (06/27/2024)  Housing: Low Risk  (06/27/2024)  Transportation Needs: No Transportation Needs (06/27/2024)  Utilities: Not At Risk (06/27/2024)  Financial Resource Strain: Patient Declined (06/16/2024)   Received from [facility]  Physical Activity: Unknown (11/30/2022)   Received from [facility]  Social Connections: Socially Isolated (06/27/2024)  Stress: No Stress Concern Present (06/24/2024)   Received from [facility]  Tobacco Use: High Risk (06/27/2024)     Readmission Risk  Interventions     No data to display

## 2024-06-30 NOTE — Discharge Summary (Addendum)
 Physician Discharge Summary  Alejandra Schneider FMW:[id number] DOB: 1955/12/29 DOA: 06/27/2024  PCP: [facility] (Inactive)  Admit date: 06/27/2024 Discharge date: 06/30/2024  Admitted From: Home Disposition:  Home  Discharge Condition:Stable CODE STATUS:FULL Diet recommendation: Regular   Brief/Interim Summary: Patient is a 68 year old female with history of seizure disorder, anxiety, narcotic abuse on methadone , COPD who was recently admitted at [facility] for COPD exacerbation now presented with intractable nausea/vomiting, unable to keep things down. Patient admitted for the management of tractable nausea, vomiting. Nausea, vomiting has improved.  Diet advanced to solid and she is tolerating.  Medically stable for discharge home today.  Following problems were addressed during the hospitalization:  Intractable nausea/vomiting/colitis:   CT showed questionable finding of colitis but no significant lower GI symptoms on admission.  Currently abdomen is soft, nondistended, nontender.  Nausea, vomiting, abdomen discomfort resolved today.  Good bowel sounds.  Tolerating solid diet.    Hypokalemia: Supplemented    Narcotic dependence:Takes 205 mg of methadone  daily.  Will restart.  She says she has taken methadone  since 23 years.  Has history of drug abuse  in the past but now in remission   COPD: Currently without exacerbation.   Seizure disorder: On Keppra  . We have recommended to follow-up with neurology as outpatient.   Hypertension: Continue clonidine   twice daily   Smoker: A pack of cigarette last for 3 days.  Counseled for cessation   Prolonged QTc: QTc of 531 on admission.   new EKG showed Qtc of 455   Discharge Diagnoses:  Principal Problem:   Intractable vomiting    Discharge Instructions  Discharge Instructions     Diet general   Complete by: As directed    Discharge instructions   Complete by: As directed    1)Please take your  medications as instructed 2)Follow up with your PCP in a week 3)Continue taking seizure medications. Follow-up with neurology as an outpatient 4)Monitor your blood pressure at home   Increase activity slowly   Complete by: As directed       Allergies as of 06/30/2024       Reactions   Valium [diazepam] Other (See Comments)   Agitation Aggression    Codeine Itching   Haldol [haloperidol Decanoate] Other (See Comments)   Torticollis    Luminal Sodium [phenobarbital] Itching   Penicillins Hives   Talwin [pentazocine] Nausea And Vomiting, Anxiety, Rash        Medication List     STOP taking these medications    Phenytek  200 MG ER capsule Generic drug: phenytoin        TAKE these medications    clonazePAM  0.5 MG tablet Commonly known as: KLONOPIN  Take 1 tablet (0.5 mg total) by mouth 2 (two) times daily as needed for anxiety. What changed:  when to take this additional instructions   cloNIDine  0.1 MG tablet Commonly known as: CATAPRES  Take 1 tablet (0.1 mg total) by mouth 2 (two) times daily. What changed: when to take this   levETIRAcetam  500 MG 24 hr tablet Commonly known as: KEPPRA  XR Take 1 tablet (500 mg total) by mouth daily.   methadone  10 MG/5ML solution Commonly known as: DOLOPHINE  Take 205 mg by mouth in the morning.   pantoprazole  40 MG tablet Commonly known as: PROTONIX  Take 1 tablet (40 mg total) by mouth daily.   TYLENOL  500 MG tablet Generic drug: acetaminophen  Take 500-1,000 mg by mouth every 6 (six) hours as needed (headaches).  Follow-up Information     [place], [facility]. Schedule an appointment as soon as possible for a visit in 1 week(s).   Specialty: Pediatrics               Allergies  Allergen Reactions   Valium [Diazepam] Other (See Comments)    Agitation Aggression    Codeine Itching   Haldol [Haloperidol Decanoate] Other (See Comments)    Torticollis    Luminal Sodium  [Phenobarbital] Itching   Penicillins Hives   Talwin [Pentazocine] Nausea And Vomiting, Anxiety and Rash    Consultations: None   Procedures/Studies: CT ABDOMEN PELVIS W CONTRAST Result Date: 06/27/2024 CLINICAL DATA:  Generalized abdominal pain.  Nausea and vomiting. EXAM: CT ABDOMEN AND PELVIS WITH CONTRAST TECHNIQUE: Multidetector CT imaging of the abdomen and pelvis was performed using the standard protocol following bolus administration of intravenous contrast. RADIATION DOSE REDUCTION: This exam was performed according to the departmental dose-optimization program which includes automated exposure control, adjustment of the mA and/or kV according to patient size and/or use of iterative reconstruction technique. CONTRAST:  OMNIPAQUE  IOHEXOL  300 MG/ML  SOLN COMPARISON:  CT abdomen pelvis dated 11/10/2021. FINDINGS: Lower chest: The visualized lung bases are clear. No intra-abdominal free air or free fluid. Hepatobiliary: The liver is unremarkable. There is mild dilatation, post cholecystectomy. No retained calcified stone noted in the central CBD. Pancreas: Unremarkable. No pancreatic ductal dilatation or surrounding inflammatory changes. Spleen: Mildly enlarged spleen measuring 14 cm in length. Adrenals/Urinary Tract: The right adrenal glands unremarkable. There is a 1 cm indeterminate left adrenal nodule. Small bilateral renal cyst and additional subcentimeter hypodense lesions which are too small to characterize. There is no hydronephrosis on either side. There is symmetric enhancement and excretion of contrast by both kidneys. The visualized ureters and urinary bladder appear unremarkable. Stomach/Bowel: There is a large hiatal hernia containing the majority of the stomach and a segment of the transverse colon. Mild thickened appearance of the distal descending/sigmoid colon may be related to underdistention. Mild colitis is less likely but not excluded. There is no bowel obstruction.  Appendectomy. Vascular/Lymphatic: Moderate aortoiliac atherosclerotic disease. The IVC is unremarkable. An infrarenal IVC filter noted. No portal venous gas. There is no adenopathy. Reproductive: The uterus is anteverted and grossly unremarkable. No suspicious adnexal masses. Other: None Musculoskeletal: No acute osseous pathology. Bilateral hip arthritic changes. IMPRESSION: 1. Mild thickened appearance of the distal descending/sigmoid colon may be related to underdistention. Mild colitis is less likely but not excluded. No bowel obstruction. 2. Large hiatal hernia containing the majority of the stomach and a segment of the transverse colon. 3. Mild splenomegaly. 4.  Aortic Atherosclerosis (ICD10-I70.0). Electronically Signed   By: Vanetta Chou M.D.   On: 06/27/2024 14:04      Subjective: Patient seen and examined at bedside today.  Very comfortable.  Sitting on the chair.  No abdomen pain, nausea or vomiting.  Feels ready to go home.  Tolerating solid diet.  Discharge Exam: Vitals:   06/29/24 2028 06/30/24 0416  BP: (!) 140/71 (!) 143/84  Pulse: 80 76  Resp: 14 16  Temp: 98.5 F (36.9 C) 97.7 F (36.5 C)  SpO2:     Vitals:   06/29/24 0900 06/29/24 1400 06/29/24 2028 06/30/24 0416  BP: (!) 170/70 (!) 141/83 (!) 140/71 (!) 143/84  Pulse:  79 80 76  Resp:   14 16  Temp:  97.8 F (36.6 C) 98.5 F (36.9 C) 97.7 F (36.5 C)  TempSrc:  Oral Oral Oral  SpO2:  94%    Weight:      Height:        General: Pt is alert, awake, not in acute distress Cardiovascular: RRR, S1/S2 +, no rubs, no gallops Respiratory: CTA bilaterally, no wheezing, no rhonchi Abdominal: Soft, NT, ND, bowel sounds + Extremities: no edema, no cyanosis    The results of significant diagnostics from this hospitalization (including imaging, microbiology, ancillary and laboratory) are listed below for reference.     Microbiology: No results found for this or any previous visit (from the past 240 hours).    Labs: BNP (last 3 results) No results for input(s): BNP in the last 8760 hours. Basic Metabolic Panel: Recent Labs  Lab 06/27/24 0902 06/27/24 2107 06/28/24 0434 06/30/24 0405  NA 137  --  135 139  K 3.3*  --  3.5 3.2*  CL 96*  --  100 102  CO2 26  --  23 27  GLUCOSE 264*  --  75 92  BUN 10  --  12 20  CREATININE 0.64  --  0.66 0.83  CALCIUM 9.7  --  8.4* 8.8*  MG 1.9 1.8  --   --    Liver Function Tests: Recent Labs  Lab 06/27/24 0902  AST 54*  ALT 36  ALKPHOS 164*  BILITOT 0.5  PROT 8.3*  ALBUMIN 4.3   Recent Labs  Lab 06/27/24 0902  LIPASE 22   No results for input(s): AMMONIA in the last 168 hours. CBC: Recent Labs  Lab 06/27/24 0902 06/28/24 0434  WBC 4.0 2.8*  NEUTROABS 3.3  --   HGB 12.5 9.5*  HCT 41.8 32.6*  MCV 78.1* 79.5*  PLT 163 131*   Cardiac Enzymes: No results for input(s): CKTOTAL, CKMB, CKMBINDEX, TROPONINI in the last 168 hours. BNP: Invalid input(s): POCBNP CBG: No results for input(s): GLUCAP in the last 168 hours. D-Dimer No results for input(s): DDIMER in the last 72 hours. Hgb A1c No results for input(s): HGBA1C in the last 72 hours. Lipid Profile No results for input(s): CHOL, HDL, LDLCALC, TRIG, CHOLHDL, LDLDIRECT in the last 72 hours. Thyroid function studies No results for input(s): TSH, T4TOTAL, T3FREE, THYROIDAB in the last 72 hours.  Invalid input(s): FREET3 Anemia work up No results for input(s): VITAMINB12, FOLATE, FERRITIN, TIBC, IRON, RETICCTPCT in the last 72 hours. Urinalysis    Component Value Date/Time   COLORURINE YELLOW 06/27/2024 1000   APPEARANCEUR CLEAR 06/27/2024 1000   LABSPEC 1.018 06/27/2024 1000   PHURINE 6.0 06/27/2024 1000   GLUCOSEU 150 (A) 06/27/2024 1000   HGBUR NEGATIVE 06/27/2024 1000   BILIRUBINUR NEGATIVE 06/27/2024 1000   KETONESUR 5 (A) 06/27/2024 1000   PROTEINUR NEGATIVE 06/27/2024 1000   UROBILINOGEN 1.0 11/08/2008 0849    NITRITE NEGATIVE 06/27/2024 1000   LEUKOCYTESUR TRACE (A) 06/27/2024 1000   Sepsis Labs Recent Labs  Lab 06/27/24 0902 06/28/24 0434  WBC 4.0 2.8*   Microbiology No results found for this or any previous visit (from the past 240 hours).  Please note: You were cared for by a hospitalist during your hospital stay. Once you are discharged, your primary care physician will handle any further medical issues. Please note that NO REFILLS for any discharge medications will be authorized once you are discharged, as it is imperative that you return to your primary care physician (or establish a relationship with a primary care physician if you do not have one) for your post hospital discharge needs so that  they can reassess your need for medications and monitor your lab values.    Time coordinating discharge: 40 minutes  SIGNED:   Ivonne Mustache, MD  [facility] 06/30/2024, 7:30 AM Pager [phone]  If 7PM-7AM, please contact night-coverage [email or web address] Password [email or web address]

## 2024-06-30 NOTE — Plan of Care (Signed)
